# Patient Record
Sex: Male | Born: 1940 | Race: White | Hispanic: No | Marital: Single | Smoking: Never smoker
Health system: Southern US, Community
[De-identification: ages and names within clinical notes are randomized; demographics above are authoritative.]

## PROBLEM LIST (undated history)

## (undated) DIAGNOSIS — IMO0002 Reserved for concepts with insufficient information to code with codable children: Secondary | ICD-10-CM

## (undated) DIAGNOSIS — M1990 Unspecified osteoarthritis, unspecified site: Secondary | ICD-10-CM

## (undated) DIAGNOSIS — I10 Essential (primary) hypertension: Secondary | ICD-10-CM

## (undated) DIAGNOSIS — F419 Anxiety disorder, unspecified: Secondary | ICD-10-CM

## (undated) DIAGNOSIS — D649 Anemia, unspecified: Secondary | ICD-10-CM

## (undated) DIAGNOSIS — I4892 Unspecified atrial flutter: Secondary | ICD-10-CM

## (undated) DIAGNOSIS — R001 Bradycardia, unspecified: Secondary | ICD-10-CM

## (undated) DIAGNOSIS — I2510 Atherosclerotic heart disease of native coronary artery without angina pectoris: Secondary | ICD-10-CM

## (undated) DIAGNOSIS — M329 Systemic lupus erythematosus, unspecified: Secondary | ICD-10-CM

## (undated) DIAGNOSIS — IMO0001 Reserved for inherently not codable concepts without codable children: Secondary | ICD-10-CM

## (undated) DIAGNOSIS — N189 Chronic kidney disease, unspecified: Secondary | ICD-10-CM

## (undated) HISTORY — PX: TOTAL HIP ARTHROPLASTY: SHX124

## (undated) HISTORY — DX: Unspecified osteoarthritis, unspecified site: M19.90

## (undated) HISTORY — DX: Systemic lupus erythematosus, unspecified: M32.9

## (undated) HISTORY — DX: Unspecified atrial flutter: I48.92

## (undated) HISTORY — DX: Chronic kidney disease, unspecified: N18.9

## (undated) HISTORY — PX: LUMBAR DISC SURGERY: SHX700

## (undated) HISTORY — PX: OTHER SURGICAL HISTORY: SHX169

## (undated) HISTORY — DX: Bradycardia, unspecified: R00.1

## (undated) HISTORY — DX: Essential (primary) hypertension: I10

## (undated) HISTORY — DX: Anemia, unspecified: D64.9

## (undated) HISTORY — DX: Atherosclerotic heart disease of native coronary artery without angina pectoris: I25.10

## (undated) HISTORY — DX: Anxiety disorder, unspecified: F41.9

## (undated) HISTORY — PX: REPLACEMENT TOTAL KNEE BILATERAL: SUR1225

## (undated) HISTORY — PX: CORONARY ANGIOPLASTY: SHX604

## (undated) HISTORY — DX: Reserved for inherently not codable concepts without codable children: IMO0001

## (undated) HISTORY — DX: Reserved for concepts with insufficient information to code with codable children: IMO0002

---

## 2008-01-14 ENCOUNTER — Ambulatory Visit: Payer: Self-pay | Admitting: Cardiology

## 2008-01-30 ENCOUNTER — Ambulatory Visit: Payer: Self-pay | Admitting: Cardiology

## 2008-02-24 ENCOUNTER — Ambulatory Visit: Payer: Self-pay | Admitting: Cardiology

## 2008-02-24 ENCOUNTER — Encounter: Payer: Self-pay | Admitting: Physician Assistant

## 2008-03-03 ENCOUNTER — Encounter: Payer: Self-pay | Admitting: Physician Assistant

## 2008-03-13 ENCOUNTER — Ambulatory Visit: Payer: Self-pay | Admitting: Cardiology

## 2008-03-13 ENCOUNTER — Encounter: Payer: Self-pay | Admitting: Cardiology

## 2008-03-18 ENCOUNTER — Ambulatory Visit: Payer: Self-pay | Admitting: Cardiovascular Disease

## 2008-03-18 ENCOUNTER — Ambulatory Visit: Admission: RE | Admit: 2008-03-18 | Discharge: 2008-03-18 | Payer: Self-pay | Admitting: Cardiovascular Disease

## 2008-03-18 ENCOUNTER — Inpatient Hospital Stay (HOSPITAL_COMMUNITY): Admission: AD | Admit: 2008-03-18 | Discharge: 2008-03-19 | Payer: Self-pay | Admitting: Cardiovascular Disease

## 2008-03-18 ENCOUNTER — Ambulatory Visit: Payer: Self-pay | Admitting: Internal Medicine

## 2008-03-18 ENCOUNTER — Inpatient Hospital Stay (HOSPITAL_BASED_OUTPATIENT_CLINIC_OR_DEPARTMENT_OTHER): Admission: RE | Admit: 2008-03-18 | Discharge: 2008-03-18 | Payer: Self-pay | Admitting: Internal Medicine

## 2008-04-07 ENCOUNTER — Ambulatory Visit: Payer: Self-pay | Admitting: Cardiology

## 2008-04-09 ENCOUNTER — Ambulatory Visit: Payer: Self-pay | Admitting: Cardiovascular Disease

## 2008-04-09 ENCOUNTER — Ambulatory Visit (HOSPITAL_COMMUNITY): Admission: RE | Admit: 2008-04-09 | Discharge: 2008-04-09 | Payer: Self-pay | Admitting: Cardiology

## 2008-04-09 ENCOUNTER — Encounter: Payer: Self-pay | Admitting: Cardiology

## 2008-04-09 ENCOUNTER — Ambulatory Visit: Payer: Self-pay | Admitting: Vascular Surgery

## 2008-04-23 ENCOUNTER — Encounter: Payer: Self-pay | Admitting: Physician Assistant

## 2008-05-01 ENCOUNTER — Encounter: Payer: Self-pay | Admitting: Physician Assistant

## 2008-05-05 ENCOUNTER — Ambulatory Visit: Payer: Self-pay | Admitting: Cardiology

## 2008-05-08 ENCOUNTER — Ambulatory Visit: Payer: Self-pay | Admitting: Cardiology

## 2008-05-11 ENCOUNTER — Encounter: Payer: Self-pay | Admitting: Cardiology

## 2008-05-26 ENCOUNTER — Encounter: Payer: Self-pay | Admitting: Cardiology

## 2008-05-27 ENCOUNTER — Ambulatory Visit: Payer: Self-pay | Admitting: Cardiology

## 2008-06-24 ENCOUNTER — Ambulatory Visit: Payer: Self-pay | Admitting: Cardiology

## 2008-07-06 ENCOUNTER — Ambulatory Visit: Payer: Self-pay | Admitting: Cardiology

## 2008-07-22 ENCOUNTER — Encounter: Payer: Self-pay | Admitting: Physician Assistant

## 2008-07-22 ENCOUNTER — Ambulatory Visit: Payer: Self-pay | Admitting: Cardiology

## 2008-09-25 ENCOUNTER — Ambulatory Visit: Payer: Self-pay | Admitting: Cardiology

## 2008-11-19 ENCOUNTER — Encounter: Payer: Self-pay | Admitting: Cardiology

## 2008-12-29 ENCOUNTER — Encounter: Payer: Self-pay | Admitting: Cardiology

## 2009-01-18 ENCOUNTER — Ambulatory Visit: Payer: Self-pay | Admitting: Cardiology

## 2009-01-20 ENCOUNTER — Ambulatory Visit: Payer: Self-pay | Admitting: Cardiology

## 2009-01-20 ENCOUNTER — Encounter: Payer: Self-pay | Admitting: Cardiology

## 2009-02-24 ENCOUNTER — Ambulatory Visit: Payer: Self-pay | Admitting: Cardiovascular Disease

## 2009-02-24 ENCOUNTER — Inpatient Hospital Stay (HOSPITAL_COMMUNITY): Admission: RE | Admit: 2009-02-24 | Discharge: 2009-03-01 | Payer: Self-pay | Admitting: Orthopedic Surgery

## 2009-02-25 ENCOUNTER — Encounter: Payer: Self-pay | Admitting: Cardiology

## 2009-03-09 ENCOUNTER — Ambulatory Visit: Payer: Self-pay | Admitting: Cardiology

## 2009-03-09 ENCOUNTER — Encounter: Admission: RE | Admit: 2009-03-09 | Discharge: 2009-03-09 | Payer: Self-pay | Admitting: Orthopedic Surgery

## 2009-03-16 ENCOUNTER — Encounter: Payer: Self-pay | Admitting: Cardiology

## 2009-03-19 ENCOUNTER — Ambulatory Visit: Payer: Self-pay | Admitting: Cardiology

## 2009-03-23 ENCOUNTER — Telehealth: Payer: Self-pay | Admitting: Cardiology

## 2009-03-30 ENCOUNTER — Ambulatory Visit: Payer: Self-pay | Admitting: Cardiology

## 2009-04-21 ENCOUNTER — Telehealth (INDEPENDENT_AMBULATORY_CARE_PROVIDER_SITE_OTHER): Payer: Self-pay | Admitting: *Deleted

## 2009-05-27 ENCOUNTER — Encounter: Payer: Self-pay | Admitting: Cardiology

## 2009-05-27 ENCOUNTER — Inpatient Hospital Stay (HOSPITAL_COMMUNITY): Admission: EM | Admit: 2009-05-27 | Discharge: 2009-05-28 | Payer: Self-pay | Admitting: Emergency Medicine

## 2009-05-28 ENCOUNTER — Encounter: Payer: Self-pay | Admitting: Infectious Diseases

## 2009-06-06 ENCOUNTER — Ambulatory Visit: Payer: Self-pay | Admitting: Infectious Diseases

## 2009-06-07 ENCOUNTER — Telehealth (INDEPENDENT_AMBULATORY_CARE_PROVIDER_SITE_OTHER): Payer: Self-pay | Admitting: *Deleted

## 2009-06-17 ENCOUNTER — Encounter: Payer: Self-pay | Admitting: Cardiology

## 2009-06-17 DIAGNOSIS — M4800 Spinal stenosis, site unspecified: Secondary | ICD-10-CM

## 2009-06-17 DIAGNOSIS — Z8679 Personal history of other diseases of the circulatory system: Secondary | ICD-10-CM | POA: Insufficient documentation

## 2009-06-17 DIAGNOSIS — F341 Dysthymic disorder: Secondary | ICD-10-CM

## 2009-06-17 DIAGNOSIS — Z96649 Presence of unspecified artificial hip joint: Secondary | ICD-10-CM

## 2009-06-17 DIAGNOSIS — R413 Other amnesia: Secondary | ICD-10-CM | POA: Insufficient documentation

## 2009-06-17 DIAGNOSIS — I10 Essential (primary) hypertension: Secondary | ICD-10-CM | POA: Insufficient documentation

## 2009-06-17 DIAGNOSIS — I259 Chronic ischemic heart disease, unspecified: Secondary | ICD-10-CM

## 2009-06-17 DIAGNOSIS — I2510 Atherosclerotic heart disease of native coronary artery without angina pectoris: Secondary | ICD-10-CM | POA: Insufficient documentation

## 2009-06-29 ENCOUNTER — Telehealth (INDEPENDENT_AMBULATORY_CARE_PROVIDER_SITE_OTHER): Payer: Self-pay | Admitting: *Deleted

## 2009-09-07 ENCOUNTER — Encounter: Payer: Self-pay | Admitting: Cardiology

## 2009-09-28 ENCOUNTER — Ambulatory Visit: Payer: Self-pay | Admitting: Cardiology

## 2009-10-05 ENCOUNTER — Ambulatory Visit (HOSPITAL_COMMUNITY): Admission: RE | Admit: 2009-10-05 | Discharge: 2009-10-05 | Payer: Self-pay | Admitting: Orthopedic Surgery

## 2009-10-05 ENCOUNTER — Encounter: Payer: Self-pay | Admitting: Cardiology

## 2010-01-24 ENCOUNTER — Encounter: Payer: Self-pay | Admitting: Cardiology

## 2010-03-29 ENCOUNTER — Encounter: Payer: Self-pay | Admitting: Cardiology

## 2010-04-29 ENCOUNTER — Encounter: Payer: Self-pay | Admitting: Cardiology

## 2010-06-09 ENCOUNTER — Encounter: Payer: Self-pay | Admitting: Cardiology

## 2010-06-14 ENCOUNTER — Encounter: Payer: Self-pay | Admitting: Cardiology

## 2010-06-17 ENCOUNTER — Encounter: Payer: Self-pay | Admitting: Cardiology

## 2010-06-24 ENCOUNTER — Ambulatory Visit: Payer: Self-pay | Admitting: Cardiology

## 2010-06-24 DIAGNOSIS — R609 Edema, unspecified: Secondary | ICD-10-CM

## 2010-06-24 DIAGNOSIS — D649 Anemia, unspecified: Secondary | ICD-10-CM

## 2010-07-13 ENCOUNTER — Encounter: Payer: Self-pay | Admitting: Cardiology

## 2010-09-06 NOTE — Assessment & Plan Note (Signed)
Summary: rov-r/s from no show on 11/11   Visit Type:  Follow-up Primary Provider:  Samuel Jester  CC:  follow-up visit.  History of Present Illness: the patient is a 70 year old male with recent coronary arteries these, hypertension, bilateral knee and hip replacements.  The patient now reports that he is left hip post surgery pass degenerated and will require a redo operation.  The patient denies any chest pain.  He denies any palpitation.  From a cardiac standpoint he is stable.  He has no orthopnea PND, presyncope or syncope.  Clinical Review Panels:  Cardiac Imaging Cardiac Cath Findings 3-vessel coronary artery disease with high-grade proximal right coronary artery lesion otherwise nonobstructive disease normal LV function patent renal arteries. (03/18/2008)    Preventive Screening-Counseling & Management  Alcohol-Tobacco     Smoking Status: never  Current Medications (verified): 1)  Benazepril Hcl 40 Mg Tabs (Benazepril Hcl) .Marland Kitchen.. 1 Tab By Mouth Once Daily 2)  Hydralazine Hcl 50 Mg Tabs (Hydralazine Hcl) .... Take 1 Tablet By Mouth Four Times A Day 3)  Labetalol Hcl 300 Mg Tabs (Labetalol Hcl) .... Take 1 Tablet By Mouth Three Times A Day 4)  Maxzide-25 37.5-25 Mg Tabs (Triamterene-Hctz) .... Take 1 Tablet By Mouth Once A Day 5)  Metoprolol Tartrate 50 Mg Tabs (Metoprolol Tartrate) .... Take 1/2 Tablet By Mouth Single Dose 6)  Nitroglycerin 0.4 Mg Subl (Nitroglycerin) .... Place 1 Tablet Under Tongue As Directed 7)  Terazosin Hcl 5 Mg Caps (Terazosin Hcl) .... Take 1 Tab By Mouth At Bedtime 8)  Isosorbide Mononitrate Cr 30 Mg Xr24h-Tab (Isosorbide Mononitrate) .... Take 1 Tablet By Mouth Once A Day 9)  Desipramine Hcl 50 Mg Tabs (Desipramine Hcl) .... Take 1 Tab By Mouth At Bedtime 10)  Ecotrin 325 Mg Tbec (Aspirin) .... Take 1 Tablet By Mouth Once A Day 11)  Alprazolam 1 Mg Tabs (Alprazolam) .... Take 1 Tab By Mouth At Bedtime 12)  Vitamin D 400 Unit Caps  (Cholecalciferol) .... Take 1 Tablet By Mouth Once A Day 13)  Omeprazole 20 Mg Cpdr (Omeprazole) .... 1/2 - 1 Tab Daily As Needed 14)  Diclofenac Sodium 50 Mg Tbec (Diclofenac Sodium) .... Take 2 Tabs Two Times A Day 15)  Niacin 500 Mg Tabs (Niacin) .... 2 Tabs Two Times A Day 16)  Red Yeast Rice Extract 600 Mg Caps (Red Yeast Rice Extract) .... Take 1 Tablet By Mouth Two Times A Day 17)  Folic Acid 800 Mcg Tabs (Folic Acid) .... Take 1 Tablet By Mouth Once A Day 18)  Zinc Magnesium Aspartate 150-3.83-10 Mg Caps (Zinc-Magnesium Aspart-Vit B6) .... Take 1 Tablet By Mouth Once A Day 19)  Oxycodone-Acetaminophen 10-650 Mg Tabs (Oxycodone-Acetaminophen) .... Take 1 Tablet By Mouth Two Times A Day As Needed 20)  Centrum Silver  Tabs (Multiple Vitamins-Minerals) .... Take 1 Tablet By Mouth Once A Day 21)  Seroquel 50 Mg Tabs (Quetiapine Fumarate) .... Take 1 Tablet By Mouth Once A Day  Allergies (verified): 1)  * Citalopram 2)  Codeine  Comments:  Nurse/Medical Assistant: The patient's medications and allergies were reviewed with the patient and were updated in the Medication and Allergy Lists. Bottles reviewed.  Past History:  Past Medical History: Last updated: 06/17/2009 severe single-vessel coronary artery disease status-post platinum drug eluding stenting of total proximal right coronary artery August 2009 Residual nonobstructed LAD and circumflex disease Normal left ventricular function Status post right femoral pseudoaneurysm successfully treated wiith thrombin injection History of transient atrial flutter normal sinus  rhythm with PVCs and PACs on subsequent cardiac monitoring hypertension dyslipidemia depression mild renal insufficiency GFR 50 and mils/min  1. Lower extremity edema.  Questionable venous stasis status post     recent hip surgery (unlike to significant volume overload). 2. Palpitations, resolved. 3. Normal LV function. 4. Single-vessel coronary artery status  post drug-eluting stenting to     the right coronary artery 10 months ago. 5. Systolic hypertension, controlled. 6. Dyslipidemia. 7. Anxiety and depression, improved. 8. Hyperkalemia, resolved. 9. 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  Past Surgical History: Last updated: 05/31/2008 bilateral total knee replacement history of lumbar surgery cataract surgery BPH  Family History: Last updated: 05/31/2008 father had premature coronary artery disease  Social History: Last updated: 05/31/2008 the patient is divorced several grown children he denies tobacco smoking he drinks occasional glass of alcohol  Risk Factors: Smoking Status: never (09/28/2009)  Past Cardiac History:  [facility] Hospitalizations:   DISCHARGE DIAGNOSES: 1. Acute confusion or short-term memory loss. 2. Left hip pain or back pain. 3. Anxiety. 4. History of coronary artery disease. 5. Hypertension. 6. Chronic arthritis. 7. Hypertension. 8. Bilateral knee replacements and bilateral hip replacements.  PROCEDURES PERFORMED ON HIM: 1. On June 06, 2009, a chest x-ray was done which was suggestive of     cardiomegaly with low lung volumes. 2. A Pelvis x-ray done on June 06, 2009, was suggestive of no acute     bony abnormality. 3. X-ray of the sacrum or coccyx done on May 27, 2009, was     suggestive of no acute bony abnormality. 4. A CT scan of the head done without contrast on May 27, 2009,     was suggestive of negative for bleed or other acute intracranial     process.  Atrophy and nonspecific white matter changes were     present.  HOSPITAL COURSE BY PROBLEM: 1. Confusion/memory loss: The patient was admitted in order to clarify     the etiology for his newly developed acute confusion/short-term     memory loss and change in his memory pattern.  Among the     differentials, we considered senile dementia, polypharmacy, side     effect of the drugs that he is taking,  encephalitis, or possibly     seizures or drug abuse are some of the causes.  We did the workup     with CT scan of the head which was not suggestive of anything.  We     did repeat the TSH, HbA1c, urine drug screen, ESR, uric acid,     everything was negative for any particular etiology suggesting the     cause for the patient's is recently developed acute short-term     memory loss.  We concluded that the patient might be suffering from     some depression as everything started when the home health     physiotherapy stopped coming to the home though the patient     suggests that he has a daughter who takes care of him everyday but     acute manic depression cannot be ruled out completely.  We     increased the doses of the patient's desipramine from 100 to 150 mg     daily, and   Social History: Smoking Status:  never  Review of Systems       The patient complains of fatigue, muscle weakness, joint pain, and depression.    Vital Signs:  Patient profile:   69  year old male Height:      64 inches Weight:      203 pounds BMI:     34.97 Pulse rate:   56 / minute BP sitting:   149 / 72  (left arm) Cuff size:   regular  Vitals Entered By: Carlye Grippe (September 28, 2009 2:34 PM)  Nutrition Counseling: Patient's BMI is greater than 25 and therefore counseled on weight management options. CC: follow-up visit   Physical Exam  Additional Exam:  General: Well-developed, well-nourished in no distress head: Normocephalic and atraumatic eyes PERRLA/EOMI intact, conjunctiva and lids normal nose: No deformity or lesions mouth normal dentition, normal posterior pharynx neck: Supple, no JVD.  No masses, thyromegaly or abnormal cervical nodes lungs: Normal breath sounds bilaterally without wheezing.  Normal percussion heart: regular rate and rhythm with normal S1 and S2, no S3 or S4.  PMI is normal.  No pathological murmurs abdomen: Normal bowel sounds, abdomen is soft and nontender  without masses, organomegaly or hernias noted.  No hepatosplenomegaly musculoskeletal: patient reports back pain.  He has decreased strength in both lower extremities.  He has difficulty flexing both left and right hip.  He is walking with a walker. pulsus: Pulse is normal in all 4 extremities Extremities: No peripheral pitting edema neurologic: Alert and oriented x 3 skin: Intact without lesions or rashes cervical nodes: No significant adenopathy psychologic: Normal affect    Impression & Recommendations:  Problem # 1:  ESSENTIAL HYPERTENSION, BENIGN (ICD-401.1) blood pressure is well-controlled.  I made no changes in his medical regimen. His updated medication list for this problem includes:    Benazepril Hcl 40 Mg Tabs (Benazepril hcl) .Marland Kitchen... 1 tab by mouth once daily    Hydralazine Hcl 50 Mg Tabs (Hydralazine hcl) .Marland Kitchen... Take 1 tablet by mouth four times a day    Labetalol Hcl 300 Mg Tabs (Labetalol hcl) .Marland Kitchen... Take 1 tablet by mouth three times a day    Maxzide-25 37.5-25 Mg Tabs (Triamterene-hctz) .Marland Kitchen... Take 1 tablet by mouth once a day    Metoprolol Tartrate 50 Mg Tabs (Metoprolol tartrate) .Marland Kitchen... Take 1/2 tablet by mouth single dose    Terazosin Hcl 5 Mg Caps (Terazosin hcl) .Marland Kitchen... Take 1 tab by mouth at bedtime    Ecotrin 325 Mg Tbec (Aspirin) .Marland Kitchen... Take 1 tablet by mouth once a day  Problem # 2:  MEMORY LOSS (ICD-780.93) the patient reported other recent episodes of memory loss.  Differential diagnoses I see no dementia, polypharmacy or side effects of drugs.  A neurological work up was essentially within normal limits.  There was also question of acute manic depression and his hip remained was increased from 100 to 150 mg p.o. daily  Problem # 3:  SPINAL STENOSIS (ICD-724.00) Assessment: Comment Only  Problem # 4:  HIP REPLACEMENT, HX OF (ICD-V43.64) the patient will need a redo operation.  Should be stable from a cardiovascular standpoint.  Problem # 5:  CORONARY ARTERY  DISEASE, S/P PTCA (ICD-414.9) the patient has no recurrent chest pain.  No indication for ischemia workup presently. His updated medication list for this problem includes:    Benazepril Hcl 40 Mg Tabs (Benazepril hcl) .Marland Kitchen... 1 tab by mouth once daily    Labetalol Hcl 300 Mg Tabs (Labetalol hcl) .Marland Kitchen... Take 1 tablet by mouth three times a day    Metoprolol Tartrate 50 Mg Tabs (Metoprolol tartrate) .Marland Kitchen... Take 1/2 tablet by mouth single dose    Nitroglycerin 0.4 Mg Subl (Nitroglycerin) .Marland Kitchen... Place  1 tablet under tongue as directed    Isosorbide Mononitrate Cr 30 Mg Xr24h-tab (Isosorbide mononitrate) .Marland Kitchen... Take 1 tablet by mouth once a day    Ecotrin 325 Mg Tbec (Aspirin) .Marland Kitchen... Take 1 tablet by mouth once a day  Patient Instructions: 1)  Your physician recommends that you continue on your current medications as directed. Please refer to the Current Medication list given to you today. 2)  Follow up in  6 months

## 2010-09-06 NOTE — Progress Notes (Signed)
Summary: Office Visit/ BLOOD PRESSURE READINGS  Office Visit/ BLOOD PRESSURE READINGS   Imported By: Dorise Hiss 09/07/2009 12:24:20  _____________________________________________________________________  External Attachment:    Type:   Image     Comment:   External Document  Appended Document: Office Visit/ BLOOD PRESSURE READINGS BP very high. Please confirm medication list- patient should not be on metoprolol and labeatalol. D/C metoprolol if so and add clonidine 0.1mg  by mouth two times a day. He needs to be seen by his LMD to have this addressed also. Further uptitration will need to happen with OV- issues to discuss. Make sure patient is compliant with medications.   Appended Document: Office Visit/ BLOOD PRESSURE READINGS Left message to return call.  Appended Document: Office Visit/ BLOOD PRESSURE READINGS Call to pt.  Updated med. list.  Please review.  States he is only taking Metoprolol as needed and uses this rarely.  He does state that he has had hip replacement recently and is having some pain lately which could be affecting his blood pressure.  Is going to orhto md today, not sure if pain in back or coming from hip.  Does has OV here on 2/22.  Do you still recommend Clonidine?    Clinical Lists Changes  Medications: Removed medication of COREG 6.25 MG TABS (CARVEDILOL) Take 1 tablet by mouth twice a day - Signed Removed medication of PLAVIX 75 MG TABS (CLOPIDOGREL BISULFATE) Take 1 tablet by mouth once a day - Signed Removed medication of FUROSEMIDE 20 MG TABS (FUROSEMIDE) Take one tablet by mouth daily. - Signed Added new medication of TERAZOSIN HCL 5 MG CAPS (TERAZOSIN HCL) Take 1 tab by mouth at bedtime - Signed Added new medication of ISOSORBIDE MONONITRATE CR 30 MG XR24H-TAB (ISOSORBIDE MONONITRATE) Take 1 tablet by mouth once a day - Signed Added new medication of DESIPRAMINE HCL 50 MG TABS (DESIPRAMINE HCL) Take 1 tab by mouth at bedtime - Signed Added new  medication of ASPIRIN 81 MG TBEC (ASPIRIN) Take 1 tablet by mouth once a day - Signed Added new medication of ALPRAZOLAM 1 MG TABS (ALPRAZOLAM) Take 1 tab by mouth at bedtime - Signed Added new medication of VITAMIN D 400 UNIT CAPS (CHOLECALCIFEROL) Take 1 tablet by mouth once a day - Signed Added new medication of OMEPRAZOLE 20 MG CPDR (OMEPRAZOLE) 1/2 - 1 tab daily as needed - Signed Added new medication of DICLOFENAC SODIUM 50 MG TBEC (DICLOFENAC SODIUM) take 2 tabs two times a day - Signed Added new medication of NIACIN 500 MG TABS (NIACIN) 2 tabs two times a day - Signed Added new medication of RED YEAST RICE EXTRACT 600 MG CAPS (RED YEAST RICE EXTRACT) Take 1 tablet by mouth two times a day - Signed Added new medication of FOLIC ACID 800 MCG TABS (FOLIC ACID) Take 1 tablet by mouth once a day - Signed Added new medication of ZINC MAGNESIUM ASPARTATE 150-3.83-10 MG CAPS (ZINC-MAGNESIUM ASPART-VIT B6) Take 1 tablet by mouth once a day - Signed Added new medication of OXYCODONE-ACETAMINOPHEN 5-325 MG TABS (OXYCODONE-ACETAMINOPHEN) 2 tabs two times a day as needed - Signed     Yes I would add clonidine 0.1 mg p.o. b.i.d. Ask patient to continue to monitor his blood pressure Lewayne Bunting, MD, [facility]  September 20, 2009 5:27 PM  Appended Document: Office Visit/ BLOOD PRESSURE READINGS Pt. was seen in office on 2/22.  Did you still want to make this change?  Appended Document: Office Visit/ BLOOD PRESSURE READINGS No BP was  controlled during last office visit.

## 2010-09-06 NOTE — Miscellaneous (Signed)
Summary: rx - Ntg.  Clinical Lists Changes  Medications: Changed medication from NITROGLYCERIN 0.4 MG SUBL (NITROGLYCERIN) Place 1 tablet under tongue as directed to NITROSTAT 0.4 MG SUBL (NITROGLYCERIN) dissolve one tablet under tongue for severe chest pain as needed every 5 minutes, not to exceed 3 in 15 min time frame - Signed Rx of NITROSTAT 0.4 MG SUBL (NITROGLYCERIN) dissolve one tablet under tongue for severe chest pain as needed every 5 minutes, not to exceed 3 in 15 min time frame;  #25 x 3;  Signed;  Entered by: Hoover Brunette, LPN;  Authorized by: Lewayne Bunting, MD, [facility];  Method used: Electronically to [facility]*, [address] [place], [place]  [address], Ph: [phone], Fax: [phone]    Prescriptions: NITROSTAT 0.4 MG SUBL (NITROGLYCERIN) dissolve one tablet under tongue for severe chest pain as needed every 5 minutes, not to exceed 3 in 15 min time frame  #25 x 3   Entered by:   Hoover Brunette, LPN   Authorized by:   Lewayne Bunting, MD, [facility]   Signed by:   Hoover Brunette, LPN on 25/95/6387   Method used:   Electronically to        [facility]* (retail)       [address]       [place], [place]  [address]       Ph: [phone]       Fax: [phone]   RxID:   [id number]

## 2010-09-06 NOTE — Letter (Signed)
Summary: External Correspondence/ FAXED [facility]  External Correspondence/ FAXED [facility]   Imported By: Dorise Hiss 05/24/2010 15:21:09  _____________________________________________________________________  External Attachment:    Type:   Image     Comment:   External Document

## 2010-09-06 NOTE — Letter (Signed)
Summary: External Correspondence/ [facility]  External Correspondence/ [facility]   Imported By: Dorise Hiss 06/23/2010 10:21:05  _____________________________________________________________________  External Attachment:    Type:   Image     Comment:   External Document

## 2010-09-06 NOTE — Assessment & Plan Note (Signed)
Summary: rov  --recently in [place] with MI  --agh   Visit Type:  Follow-up Primary Provider:  Samuel Jester   History of Present Illness: the patientthis 70 year old male with a history of single-vessel coronary disease status post drug-eluting stent placement to the proximal right coronary artery in 2009. He has residual nonobstructive disease of the LAD and circumflex coronary area he has normal LV function. He has a history of transient atrial flutter. He has mild renal insufficiency.the patient also has a history of anxiety and chronic arthritis and is status post bilateral knee and hip replacements. the patient required a redo operation because of left hip prosthesis has fractures through the acetabulum and migrated superior and medially. laboratory work from June 2011 shows total cholesterol 128 LDL cholesterol 71 HDL cholesterol 44.creatinine 1.14. Potassium 5.3 sodium 138. Alkaline phosphatase elevated at 130. Had to undergo surgery x2. Able to walk with walker.feels very weak. Developed. still on Vancomycin through PICC line.  No paplps. No scp. But possible small NSTEMI. BP difficult to control. Did echo at [facility]. 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 Stop Triamterene/HCTZ. START la six 40mg  day with spironlactone.  Dizzy when standing up. Anemia. Needed 9 units of blood. Hb =8. Needs anemia Dorna Bloom.  Preventive Screening-Counseling & Management  Alcohol-Tobacco     Smoking Status: never  Current Medications (verified): 1)  Benazepril Hcl 40 Mg Tabs (Benazepril Hcl) .Marland Kitchen.. 1 Tab By Mouth Once Daily 2)  Hydralazine Hcl 25 Mg Tabs (Hydralazine Hcl) .... Take 3 Tablet By Mouth Four Times A Day 3)  Labetalol Hcl 300 Mg Tabs (Labetalol Hcl) .... Take 1 Tablet By Mouth Three Times A Day 4)  Nitrostat 0.4 Mg Subl (Nitroglycerin) .... Dissolve One Tablet Under Tongue For Severe Chest Pain As Needed Every 5 Minutes, Not To Exceed 3 in 15 Min Time Frame 5)  Terazosin Hcl 5 Mg  Caps (Terazosin Hcl) .... Take 1 Tab By Mouth At Bedtime 6)  Isosorbide Mononitrate Cr 30 Mg Xr24h-Tab (Isosorbide Mononitrate) .... Take 1 Tablet By Mouth Once A Day 7)  Desipramine Hcl 100 Mg Tabs (Desipramine Hcl) .... Take 1 Tablet By Mouth Once A Day At Bedtime 8)  Ecotrin 325 Mg Tbec (Aspirin) .... Take 1 Tablet By Mouth Once A Day 9)  Alprazolam 1 Mg Tabs (Alprazolam) .... Take 1-2 Tab By Mouth At Bedtime As Needed 10)  Omeprazole 20 Mg Cpdr (Omeprazole) .... 1/2  Tab Daily As Needed 11)  Niacin 500 Mg Tabs (Niacin) .... 2 Tabs Two Times A Day 12)  Red Yeast Rice Extract 600 Mg Caps (Red Yeast Rice Extract) .... Take 1 Tablet By Mouth Two Times A Day 13)  Folic Acid 800 Mcg Tabs (Folic Acid) .... Take 1 Tablet By Mouth Once A Day 14)  Zinc Magnesium Aspartate 150-3.83-10 Mg Caps (Zinc-Magnesium Aspart-Vit B6) .... Take 1 Tablet By Mouth Once A Day 15)  Centrum Silver  Tabs (Multiple Vitamins-Minerals) .... Take 1 Tablet By Mouth Once A Day 16)  Seroquel 50 Mg Tabs (Quetiapine Fumarate) .... Take 1 Tablet By Mouth Once A Day 17)  Vancomycin Hcl 1000 Mg Solr (Vancomycin Hcl) .... Infuse 1.5gm/248ml Daily 18)  Minoxidil 2.5 Mg Tabs (Minoxidil) .... Take 2 Tablet By Mouth Once A Day 19)  Ferrex 150 150 Mg Caps (Polysaccharide Iron Complex) .... Take 1 Tablet By Mouth Two Times A Day 20)  Clonidine Hcl 0.1 Mg Tabs (Clonidine Hcl) .... Take 1 Tablet By Mouth Two Times A Day  21)  Risaquad  Caps (Probiotic Product) .... Take 1 Tablet By Mouth Once A Day 22)  First-Mouthwash Blm  Susp (Dph-Lido-Alhydr-Mghydr-Simeth) .Marland Kitchen.. 10ml By Mouth Four Times A Day 23)  Vitamin D3 400 Unit Tabs (Cholecalciferol) .... Take 2 Tabs (800 Units) Daily 24)  Calcium 400mg  .... Take 1 Tablet By Mouth Once A Day 25)  Lasix 40 Mg Tabs (Furosemide) .... Take 1 Tablet By Mouth Once A Day 26)  Spironolactone 25 Mg Tabs (Spironolactone) .... Take 1 Tablet By Mouth Once A Day  Allergies (verified): 1)  * Citalopram 2)   Codeine  Comments:  Nurse/Medical Assistant: The patient's medication bottles and allergies were reviewed with the patient and were updated in the Medication and Allergy Lists.  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 Lower extremity edema.  Questionable venous stasis status post     recent hip surgery (unlike to significant volume overload). 2. Palpitations, resolved. 3. Normal LV function. 4. Single-vessel coronary artery status post drug-eluting stenting to     the right coronary artery 10 months ago. 5. Systolic hypertension, controlled. 6. Dyslipidemia. 7. Anxiety and depression, improved. 8. Hyperkalemia, resolved. 9. 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  Past Surgical History: Last updated: 05/31/2008 bilateral total knee replacement history of lumbar surgery cataract surgery BPH  Family History: Last updated: 05/31/2008 father had premature coronary artery disease  Social History: Last updated: 05/31/2008 the patient is divorced several grown children he denies tobacco smoking he drinks occasional glass of alcohol  Risk Factors: Smoking Status: never (06/24/2010)  Past Cardiac History:  [facility] Hospitalizations:   DISCHARGE DIAGNOSES: 1. Acute confusion or short-term memory loss. 2. Left hip pain or back pain. 3. Anxiety. 4. History of coronary artery disease. 5. Hypertension. 6. Chronic arthritis. 7.  Hypertension. 8. Bilateral knee replacements and bilateral hip replacements.  PROCEDURES PERFORMED ON HIM: 1. On June 06, 2009, a chest x-ray was done which was suggestive of     cardiomegaly with low lung volumes. 2. A Pelvis x-ray done on June 06, 2009, was suggestive of no acute     bony abnormality. 3. X-ray of the sacrum or coccyx done on May 27, 2009, was     suggestive of no acute bony abnormality. 4. A CT scan of the head done without contrast on May 27, 2009,     was suggestive of negative for bleed or other acute intracranial     process.  Atrophy and nonspecific white matter changes were     present.  HOSPITAL COURSE BY PROBLEM: 1. Confusion/memory loss: The patient was admitted in order to clarify     the etiology for his newly developed acute confusion/short-term     memory loss and change in his memory pattern.  Among the     differentials, we considered senile dementia, polypharmacy, side     effect of the drugs that he is taking, encephalitis, or possibly     seizures or drug abuse are some of the causes.  We did the workup     with CT scan of the head which was not  suggestive of anything.  We     did repeat the TSH, HbA1c, urine drug screen, ESR, uric acid,     everything was negative for any particular etiology suggesting the     cause for the patient's is recently developed acute short-term     memory loss.  We concluded that the patient might be suffering from     some depression as everything started when the home health     physiotherapy stopped coming to the home though the patient     suggests that he has a daughter who takes care of him everyday but     acute manic depression cannot be ruled out completely.  We     increased the doses of the patient's desipramine from 100 to 150 mg     daily, and   Vital Signs:  Patient profile:   70 year old male Height:      64 inches Weight:      192 pounds Pulse rate:   60 / minute BP sitting:   147 /  66  (left arm) Cuff size:   large  Vitals Entered By: Carlye Grippe (June 24, 2010 1:24 PM)  Physical Exam  Additional Exam:  General: Well-developed, well-nourished in no distress head: Normocephalic and atraumatic eyes PERRLA/EOMI intact, conjunctiva and lids normal nose: No deformity or lesions mouth normal dentition, normal posterior pharynx neck: Supple, no JVD.  No masses, thyromegaly or abnormal cervical nodes lungs: Normal breath sounds bilaterally without wheezing.  Normal percussion heart: regular rate and rhythm with normal S1 and S2, no S3 or S4.  PMI is normal.  No pathological murmurs abdomen: Normal bowel sounds, abdomen is soft and nontender without masses, organomegaly or hernias noted.  No hepatosplenomegaly musculoskeletal: patient reports back pain.  He has decreased strength in both lower extremities.  He has difficulty flexing both left and right hip.  He is walking with a walker. pulsus: Pulse is normal in all 4 extremities Extremities: No peripheral pitting edema neurologic: Alert and oriented x 3 skin: Intact without lesions or rashes cervical nodes: No significant adenopathy psychologic: Normal affect    Impression & Recommendations:  Problem # 1:  ANEMIA (ICD-285.9) the patient is followed for his anemia.  Apparently after his last surgery he required 9 units of blood.  We will check his CBC and iron studies particularly in light of his coronary artery disease. Orders: T-CBC w/Diff [id number]) T-Basic Metabolic Panel [id number]) T-Reticulocyte Count, Manual (29562) T-Iron [id number]) T-Iron Binding Capacity (TIBC) ([id number]) T-Ferritin [id number]) T-Vitamin B12 ([id number]) T-Folic Acid; RBC ([id number]) T- * Misc. Laboratory test [id number])  Problem # 2:  SPINAL STENOSIS (ICD-724.00) difficulty walking  Problem # 3:  ESSENTIAL HYPERTENSION, BENIGN (ICD-401.1) the patient's blood pressure difficult to control and has been placed on  minoxidil.  The likely has edema secondary to minoxidil.  Will stop triamterene chlorothiazide and start Lasix 40 mg a day and spironolactone. The following medications were removed from the medication list:    Maxzide-25 37.5-25 Mg Tabs (Triamterene-hctz) .Marland Kitchen... Take 1 tablet by mouth once a day    Metoprolol Tartrate 50 Mg Tabs (Metoprolol tartrate) .Marland Kitchen... Take 1/2 tablet by mouth single dose His updated medication list for this problem includes:    Benazepril Hcl 40 Mg Tabs (Benazepril hcl) .Marland Kitchen... 1 tab by mouth once daily    Hydralazine Hcl 25 Mg Tabs (Hydralazine hcl) .Marland Kitchen... Take 3 tablet by mouth four times a day    Labetalol Hcl 300 Mg Tabs (  Labetalol hcl) .Marland Kitchen... Take 1 tablet by mouth three times a day    Terazosin Hcl 5 Mg Caps (Terazosin hcl) .Marland Kitchen... Take 1 tab by mouth at bedtime    Ecotrin 325 Mg Tbec (Aspirin) .Marland Kitchen... Take 1 tablet by mouth once a day    Minoxidil 2.5 Mg Tabs (Minoxidil) .Marland Kitchen... Take 2 tablet by mouth once a day    Clonidine Hcl 0.1 Mg Tabs (Clonidine hcl) .Marland Kitchen... Take 1 tablet by mouth two times a day    Lasix 40 Mg Tabs (Furosemide) .Marland Kitchen... Take 1 tablet by mouth once a day    Spironolactone 25 Mg Tabs (Spironolactone) .Marland Kitchen... Take 1 tablet by mouth once a day  Problem # 4:  CORONARY ARTERY DISEASE, S/P PTCA (ICD-414.9) patient history coronary artery disease with drug alluding stent to the proximal right coronary artery in 2009.  Currently not on Plavix anymore he has no chest pain it's unclear if during his recent surgery he had a possible small non-ST elevation microinfarction in any event this will be treated medically The following medications were removed from the medication list:    Metoprolol Tartrate 50 Mg Tabs (Metoprolol tartrate) .Marland Kitchen... Take 1/2 tablet by mouth single dose His updated medication list for this problem includes:    Benazepril Hcl 40 Mg Tabs (Benazepril hcl) .Marland Kitchen... 1 tab by mouth once daily    Labetalol Hcl 300 Mg Tabs (Labetalol hcl) .Marland Kitchen... Take 1 tablet by  mouth three times a day    Nitrostat 0.4 Mg Subl (Nitroglycerin) .Marland Kitchen... Dissolve one tablet under tongue for severe chest pain as needed every 5 minutes, not to exceed 3 in 15 min time frame    Isosorbide Mononitrate Cr 30 Mg Xr24h-tab (Isosorbide mononitrate) .Marland Kitchen... Take 1 tablet by mouth once a day    Ecotrin 325 Mg Tbec (Aspirin) .Marland Kitchen... Take 1 tablet by mouth once a day  Other Orders: EKG w/ Interpretation (93000)  Patient Instructions: 1)  Stop Triamterene 2)  Vitamin D3 800 units daily 3)  Calcium 400mg  daily 4)  Lasix 40mg  daily 5)  Spironolactone 25mg  daily 6)  Labs: to be done with home health on Monday, 11/28 7)  Follow up in  3 months Prescriptions: SPIRONOLACTONE 25 MG TABS (SPIRONOLACTONE) Take 1 tablet by mouth once a day  #30 x 6   Entered by:   Hoover Brunette, LPN   Authorized by:   Lewayne Bunting, MD, [facility]   Signed by:   Hoover Brunette, LPN on 16/05/9603   Method used:   Electronically to        [facility]* (retail)       [address]       [place], [place]  [address]       Ph: [phone]       Fax: [phone]   RxID:   [id number] LASIX 40 MG TABS (FUROSEMIDE) Take 1 tablet by mouth once a day  #30 x 6   Entered by:   Hoover Brunette, LPN   Authorized by:   Lewayne Bunting, MD, [facility]   Signed by:   Hoover Brunette, LPN on 06/03/2535   Method used:   Electronically to        [facility]* (retail)       [address]       [place], [place]  [address]       Ph: [phone]       Fax: [phone]   RxID:   [id number]

## 2010-09-06 NOTE — Letter (Signed)
Summary: External Correspondence/ [facility]  External Correspondence/ [facility]   Imported By: Dorise Hiss 06/23/2010 10:19:23  _____________________________________________________________________  External Attachment:    Type:   Image     Comment:   External Document

## 2010-10-11 ENCOUNTER — Ambulatory Visit: Payer: Self-pay | Admitting: Family Medicine

## 2010-11-10 LAB — BASIC METABOLIC PANEL
CO2: 27 mEq/L (ref 19–32)
Calcium: 8.7 mg/dL (ref 8.4–10.5)
Chloride: 100 mEq/L (ref 96–112)
GFR calc Af Amer: >60 mL/min (ref >60)
Sodium: 134 mEq/L — ABNORMAL LOW (ref 135–145)

## 2010-11-10 LAB — CBC
HCT: 32.7 % — ABNORMAL LOW (ref 39.0–52.0)
Hemoglobin: 11 g/dL — ABNORMAL LOW (ref 13.0–17.0)
MCHC: 34.8 g/dL (ref 30.0–36.0)
MCHC: 34.9 g/dL (ref 30.0–36.0)
MCV: 98.7 fL (ref 78.0–100.0)
MCV: 98.9 fL (ref 78.0–100.0)
RBC: 3.17 MIL/uL — ABNORMAL LOW (ref 4.22–5.81)
RBC: 3.32 MIL/uL — ABNORMAL LOW (ref 4.22–5.81)
WBC: 6.7 10*3/uL (ref 4.0–10.5)

## 2010-11-10 LAB — URINE DRUGS OF ABUSE SCREEN W ALC, ROUTINE (REF LAB)
Barbiturate Quant, Ur: NEGATIVE
Cocaine Metabolites: NEGATIVE
Creatinine,U: 108.8 mg/dL
Ethyl Alcohol: <10 mg/dL (ref <10)
Methadone: NEGATIVE
Opiate Screen, Urine: NEGATIVE

## 2010-11-10 LAB — COMPREHENSIVE METABOLIC PANEL
AST: 37 U/L (ref 0–37)
BUN: 22 mg/dL (ref 6–23)
CO2: 26 mEq/L (ref 19–32)
Calcium: 8.9 mg/dL (ref 8.4–10.5)
Creatinine, Ser: 1.06 mg/dL (ref 0.4–1.5)
GFR calc Af Amer: >60 mL/min (ref >60)
GFR calc non Af Amer: >60 mL/min (ref >60)
Glucose, Bld: 102 mg/dL — ABNORMAL HIGH (ref 70–99)

## 2010-11-10 LAB — RPR: RPR Ser Ql: NONREACTIVE

## 2010-11-10 LAB — DIFFERENTIAL
Lymphocytes Relative: 17 % (ref 12–46)
Lymphs Abs: 1.4 10*3/uL (ref 0.7–4.0)
Neutro Abs: 5.5 10*3/uL (ref 1.7–7.7)
Neutrophils Relative %: 69 % (ref 43–77)

## 2010-11-10 LAB — URINALYSIS, ROUTINE W REFLEX MICROSCOPIC
Bilirubin Urine: NEGATIVE
Hgb urine dipstick: NEGATIVE
Specific Gravity, Urine: 1.026 (ref 1.005–1.030)
Urobilinogen, UA: 0.2 mg/dL (ref 0.0–1.0)

## 2010-11-10 LAB — BENZODIAZEPINE, QUANTITATIVE, URINE
Flurazepam GC/MS Conf: NEGATIVE
Oxazepam GC/MS Conf: NEGATIVE

## 2010-11-10 LAB — PROPOXYPHENE, CONFIRMATION
Propoxyphene Metabolite: 700 ng/mL
Propoxyphene or Metab. GC/MS: NEGATIVE

## 2010-11-10 LAB — FOLATE: Folate: >20 ng/mL

## 2010-11-10 LAB — URIC ACID: Uric Acid, Serum: 7.7 mg/dL (ref 4.0–7.8)

## 2010-11-10 LAB — TSH: TSH: 1.268 u[IU]/mL (ref 0.350–4.500)

## 2010-11-10 LAB — HEMOGLOBIN A1C: Mean Plasma Glucose: 105 mg/dL

## 2010-11-10 LAB — VITAMIN B12: Vitamin B-12: >2000 pg/mL — ABNORMAL HIGH (ref 211–911)

## 2010-11-10 LAB — IRON AND TIBC: Saturation Ratios: 20 % (ref 20–55)

## 2010-11-13 LAB — BASIC METABOLIC PANEL
BUN: 13 mg/dL (ref 6–23)
BUN: 15 mg/dL (ref 6–23)
BUN: 18 mg/dL (ref 6–23)
CO2: 27 mEq/L (ref 19–32)
CO2: 27 mEq/L (ref 19–32)
Calcium: 7.7 mg/dL — ABNORMAL LOW (ref 8.4–10.5)
Calcium: 8.2 mg/dL — ABNORMAL LOW (ref 8.4–10.5)
Chloride: 94 mEq/L — ABNORMAL LOW (ref 96–112)
Chloride: 95 mEq/L — ABNORMAL LOW (ref 96–112)
Creatinine, Ser: 0.91 mg/dL (ref 0.4–1.5)
Creatinine, Ser: 1.36 mg/dL (ref 0.4–1.5)
GFR calc Af Amer: >60 mL/min (ref >60)
GFR calc non Af Amer: 52 mL/min — ABNORMAL LOW (ref >60)
GFR calc non Af Amer: >60 mL/min (ref >60)
GFR calc non Af Amer: >60 mL/min (ref >60)
Glucose, Bld: 108 mg/dL — ABNORMAL HIGH (ref 70–99)
Glucose, Bld: 109 mg/dL — ABNORMAL HIGH (ref 70–99)
Glucose, Bld: 116 mg/dL — ABNORMAL HIGH (ref 70–99)
Glucose, Bld: 96 mg/dL (ref 70–99)
Potassium: 4.1 mEq/L (ref 3.5–5.1)
Potassium: 4.4 mEq/L (ref 3.5–5.1)
Sodium: 126 mEq/L — ABNORMAL LOW (ref 135–145)
Sodium: 129 mEq/L — ABNORMAL LOW (ref 135–145)

## 2010-11-13 LAB — TYPE AND SCREEN: Antibody Screen: NEGATIVE

## 2010-11-13 LAB — CBC
HCT: 27.7 % — ABNORMAL LOW (ref 39.0–52.0)
HCT: 27.8 % — ABNORMAL LOW (ref 39.0–52.0)
HCT: 30.5 % — ABNORMAL LOW (ref 39.0–52.0)
HCT: 32.5 % — ABNORMAL LOW (ref 39.0–52.0)
Hemoglobin: 10.1 g/dL — ABNORMAL LOW (ref 13.0–17.0)
Hemoglobin: 11.2 g/dL — ABNORMAL LOW (ref 13.0–17.0)
Hemoglobin: 9.6 g/dL — ABNORMAL LOW (ref 13.0–17.0)
Hemoglobin: 9.7 g/dL — ABNORMAL LOW (ref 13.0–17.0)
MCHC: 34.8 g/dL (ref 30.0–36.0)
MCV: 92.6 fL (ref 78.0–100.0)
MCV: 93.8 fL (ref 78.0–100.0)
Platelets: 200 10*3/uL (ref 150–400)
Platelets: 259 10*3/uL (ref 150–400)
Platelets: 271 10*3/uL (ref 150–400)
RBC: 2.98 MIL/uL — ABNORMAL LOW (ref 4.22–5.81)
RBC: 3.11 MIL/uL — ABNORMAL LOW (ref 4.22–5.81)
RBC: 3.46 MIL/uL — ABNORMAL LOW (ref 4.22–5.81)
RDW: 14.7 % (ref 11.5–15.5)
RDW: 14.9 % (ref 11.5–15.5)
RDW: 15.6 % — ABNORMAL HIGH (ref 11.5–15.5)
WBC: 6.2 10*3/uL (ref 4.0–10.5)
WBC: 6.6 10*3/uL (ref 4.0–10.5)

## 2010-11-13 LAB — COMPREHENSIVE METABOLIC PANEL
Albumin: 3.8 g/dL (ref 3.5–5.2)
Alkaline Phosphatase: 54 U/L (ref 39–117)
BUN: 23 mg/dL (ref 6–23)
CO2: 29 mEq/L (ref 19–32)
Chloride: 99 mEq/L (ref 96–112)
Creatinine, Ser: 1.18 mg/dL (ref 0.4–1.5)
GFR calc non Af Amer: >60 mL/min (ref >60)
Glucose, Bld: 99 mg/dL (ref 70–99)
Potassium: 4.7 mEq/L (ref 3.5–5.1)
Total Bilirubin: 0.3 mg/dL (ref 0.3–1.2)

## 2010-11-13 LAB — POCT I-STAT 4, (NA,K, GLUC, HGB,HCT)
Glucose, Bld: 106 mg/dL — ABNORMAL HIGH (ref 70–99)
Hemoglobin: 8.5 g/dL — ABNORMAL LOW (ref 13.0–17.0)
Potassium: 4.4 mEq/L (ref 3.5–5.1)

## 2010-11-13 LAB — URINALYSIS, ROUTINE W REFLEX MICROSCOPIC
Bilirubin Urine: NEGATIVE
Glucose, UA: NEGATIVE mg/dL
Ketones, ur: NEGATIVE mg/dL
Nitrite: NEGATIVE
Protein, ur: NEGATIVE mg/dL
pH: 7 (ref 5.0–8.0)

## 2010-11-13 LAB — OSMOLALITY, URINE: Osmolality, Ur: 566 mOsm/kg (ref 390–1090)

## 2010-11-13 LAB — DIFFERENTIAL
Basophils Absolute: 0 10*3/uL (ref 0.0–0.1)
Basophils Relative: 1 % (ref 0–1)
Lymphocytes Relative: 24 % (ref 12–46)
Monocytes Absolute: 0.7 10*3/uL (ref 0.1–1.0)
Neutro Abs: 4.2 10*3/uL (ref 1.7–7.7)

## 2010-11-13 LAB — APTT: aPTT: 27 seconds (ref 24–37)

## 2010-11-13 LAB — URINE CULTURE: Colony Count: 9000

## 2010-11-13 LAB — PREPARE RBC (CROSSMATCH)

## 2010-11-13 LAB — HEMOGLOBIN AND HEMATOCRIT, BLOOD: Hemoglobin: 10.4 g/dL — ABNORMAL LOW (ref 13.0–17.0)

## 2010-11-13 LAB — PROTIME-INR: Prothrombin Time: 13.2 seconds (ref 11.6–15.2)

## 2010-11-22 ENCOUNTER — Ambulatory Visit: Payer: Self-pay | Admitting: Cardiology

## 2010-12-20 NOTE — Op Note (Signed)
NAMEJAHKEEM, [name]                ACCOUNT NO.:  [id number]   MEDICAL RECORD NO.:  [id number]          PATIENT TYPE:  OIB   LOCATION:  2899                         FACILITY:  [facility]   PHYSICIAN:  Veverly Fells. Excell Seltzer, MD  DATE OF BIRTH:  Dec 29, 1940   DATE OF PROCEDURE:  04/09/2008  DATE OF DISCHARGE:  04/09/2008                               OPERATIVE REPORT   PROCEDURE:  Percutaneous thrombin injection.   INDICATION:  [name] is a 70 year old gentleman, who underwent recent  PCI via the right femoral artery.  This was performed on March 18, 2008.  At his postprocedure followup on April 07, 2008, a right  femoral artery bruit was noted.  An ultrasound demonstrated a common  femoral artery pseudoaneurysm.  Today, this measured 2.5 cm in diameter.  There was a 0.5 cm neck into the pseudoaneurysm.  There was a small  amount of thrombus, but the vast majority of the aneurysm had flow  communicating from the common femoral artery.  I elected to proceed with  percutaneous thrombin injection.   Risks and indications of procedure were reviewed with the patient.  Informed consent was obtained.  The right groin was prepped and draped  under normal sterile technique.  A 5000 units of thrombin was diluted  using normal technique.  An Echotip needle was used to enter the  pseudoaneurysm sac under ultrasound guidance.  Blood return was achieved  and the needle was easily visible into the sac of the pseudoaneurysm and  away from the neck.  A small amount of thrombin was injected and there  was immediate thrombosis of the pseudoaneurysm.  Common femoral and  posterior tibial arterial wave forms remained normal postprocedure.  There was no residual flow into the pseudoaneurysm.  The patient  tolerated the procedure well.  Of note, he was premedicated with 4 mg of  morphine.   FINAL CONCLUSION:  Successful percutaneous thrombin injection for  treatment of right femoral artery postprocedure  pseudoaneurysm.   The patient will be observed for 1 hour and continued on bedrest.  He  will ambulate under observation and then will be ready for discharge to  home.      Veverly Fells. Excell Seltzer, MD  Electronically Signed     MDC/MEDQ  D:  04/09/2008  T:  04/10/2008  Job:  [id number]   cc:   Learta Codding, MD,FACC  Samuel Jester

## 2010-12-20 NOTE — Assessment & Plan Note (Signed)
[facility]                          [facility] OFFICE NOTE   NAME:HODGESJasiri, Hanawalt                       MRN:          [id number]  DATE:01/30/2008                            DOB:          June 18, 1941    REFERRING PHYSICIAN:  Jake Church   REASON FOR CONSULTATION:  Recent hospitalization for chest pain and  palpitations.   HISTORY OF PRESENT ILLNESS:  The patient is a 70 year old male with a  prior history of hypertension as well as significant chronic back  problems.  The patient was recently hospitalized on January 14, 2008 on the  hospitalist service at [facility] with substernal chest pain.  The patient ruled out for myocardial function by enzymes, and Dr.  Doyne Keel ordered an outpatient Cardiolite.  He also had a CT angiogram  done due to elevated D-dimer, but there was no evidence of pulmonary  embolism.  The Cardiolite study was essentially negative for ischemia.  The patient had a normal ejection fraction and no reversible defect.  His echocardiogram also was essentially within normal limits.   The patient now presents for followup.  The patient interestingly  reports a longstanding history of acid reflux, which particularly gets  worse at night or when he eats late.  He has a burning sensation in the  chest as well as a tightness.  His symptoms of chest tightness are  nonexertional; they appear to be mainly happening at rest and in the  evening, possibly related to onset of heartburn.  The patient states  that he is physically very active and actually he states, I feel better  when I exercise.  He reports the chest pain as a squeezing tight  sensation in the lower substernal area without any radiation into the  jaw, neck, or arms.  He received nitroglycerin in the hospital, which  did appear to help.  He also notes occasional palpitations, which are  short lived.  He reports what I feel are premature beats with possible  brief  pauses.  He denies, however, any frank syncope.  There is a  history of syncope possibly 3 years ago, for which he was evaluated at  [facility] in [facility], and no definite cause was established.   PAST MEDICAL HISTORY:  1. Hypertension.  2. Total knee replacements bilaterally.  3. Back surgery with bone fusion.   ALLERGIES:  STATIN DRUG THERAPY intolerant secondary to elevated  enzymes; CODEINE causes itch and rash.   SOCIAL HISTORY:  The patient is a nonsmoker.  Denies any alcohol use.  He lives by himself.   FAMILY HISTORY:  Notable for mother who died of stroke and father from a  heart attack, but no premature CAD.  He has several sisters, 8 in total,  and 4 brothers.  Several sisters had stroke and one brother was born  with heart dropsy.   REVIEW OF SYSTEMS:  As per HPI.  The patient denies any nausea or  vomiting.  No fever or chills.  No melena or hematochezia.  No dysuria  or frequency.  Positive for palpitations.  Positive for chest  pain as  outlined above.   PHYSICAL EXAMINATION:  VITAL SIGNS:  Blood pressure is 149/67, heart  rate is 57, and weight is 221 pounds.  GENERAL:  A well-nourished white male in no apparent distress.  HEENT:  Pupils are equal.  Conjunctivae are clear.  NECK:  Supple.  No thyromegaly.  No carotid bruits.  JVD is 6 cm.  LUNGS:  Clear breath sounds bilaterally.  No wheezing.  HEART:  Regular rate and rhythm with an occasional extrasystole with  normal S1 and S2 and no pathological murmurs.  ABDOMEN:  Soft and nontender, no rebound or guarding, and good bowel  sounds.  EXTREMITIES:  No cyanosis, clubbing, or edema.  NEURO:  The patient is alert, oriented, and grossly nonfocal.   Echocardiogram, normal sinus rhythm with occasional PVCs.   PROBLEM LIST:  1. Palpitations, rule out premature atrial contractions versus      arrhythmia.  2. Substernal chest pain.      a.     Rule out ischemic heart disease (unlikely).      b.      Esophageal reflux disease with esophageal spasm (most       likely).  3. Gastroesophageal reflux disease.  4. History of chronic back pain.   PLAN:  1. The patient's symptoms are atypical for ischemic heart disease      particularly, as they occur at rest and appear to be associated      with reflux.  I suspect the patient has significant esophageal      spasm.  I have asked the patient to take Zantac in the evening.  He      occasionally takes p.r.n. Prilosec. and I have told him to      discontinue this practice for now or unless he wants to take      Prilosec on a daily basis, and he should not take Zantac.  2. I did give the patient p.r.n. nitroglycerin, as this appeared to      have helped him in the hospital.  I have told him that is not      necessarily diagnostic of ischemic heart disease and may ameliorate      his esophageal spasm.  3. Based on the patient's Cardiolite stress study, I think it is      unlikely that he has severe ischemic heart disease, and I have told      him we will apply a cardiac monitor to rule out any significant      arrhythmia.  If this is negative and if his symptomatology      improves, no further workup is required.  However, if his      symptomatology does not improve, certainly catheterization can be      considered at that time.  We discussed it at length in the office      today.    Learta Codding, MD,FACC  Electronically Signed   GED/MedQ  DD: 01/30/2008  DT: 01/31/2008  Job #: [id number]   cc:   Jake Church

## 2010-12-20 NOTE — Assessment & Plan Note (Signed)
[facility]                          [facility] NOTE   Luis, [name]                       MRN:          [id number]  DATE:03/13/2008                            DOB:          May 26, 1941    PRIMARY CARDIOLOGIST:  Learta Codding, MD,FACC.   PRIMARY CARE PHYSICIAN:  Dr. Samuel Jester.   REASON FOR VISIT:  Scheduled followup.   HISTORY OF PRESENT ILLNESS:  I am seeing [name] in Dr. Margarita Mail  absence.  He is a 70 year old gentleman last seen by Dr. Andee Lineman in June.  His history is well detailed in the previous note.  In summary, he has  had problems with intermittent recurrent chest pain described as a mild  pressure.  He has also had intermittent palpitations although without  any frank dizziness or syncope.  In reviewing the available data he  underwent an echocardiogram back in June which demonstrated a normal  left ventricular ejection fraction of 55-60% with normal right  ventricular function and no major valvular abnormalities.  He also had  an inpatient Cardiolite done which showed no frank ischemia.  There was  a fixed inferior defect that was likely attenuation artifact given  normal wall motion in that area and there were no diagnostic  electrocardiographic changes.  He did have premature ventricular  complexes noted.  Further history finds a previous unexplained episode  of possible syncope approximately 3 years ago.  He has had no subsequent  events.  Dr. Andee Lineman had a cardiac monitor placed and I reviewed these  strips today.  Two different arrhythmias are noted.  He had a transient  episode of atrial flutter with controlled heart rate and associated with  some pauses up to 2 and 2-1/2 seconds, sinus bradycardia also noted with  similar pauses.  In addition, there is a single episode of what looks to  be nonsustained ventricular tachycardia.  Cannot entirely exclude an  aberrant supraventricular arrhythmia although this  looks more typical  for a ventricular arrhythmia.  We reviewed these in detail today  including his inpatient testing.  He also tells me parenthetically he  will be undergoing left hip replacement surgery later in the year and  will require cardiac surgical clearance for that.  Dr. Andee Lineman felt that  there may be a component of reflux and esophageal spasm when he last saw  him.  The patient has actually been taking Maalox three times a day and  feels as though some of his symptoms have improved actually.  Even  palpitations to some degree.  He is not compliant with CPAP and also  tells me that he stopped hydrochlorothiazide and cut his labetalol to  once daily given concerns about slow heart rate and findings via  laboratory assessment that his sodium was low at 131, now up to 134 by  followup blood work on July 28.  BUN and creatinine at that time were 19  and 1.3 and potassium was 4.0.  We discussed the situation and my  feeling is that we should be very clear in excluding underlying  obstructive heart  disease, particularly given his ventricular arrhythmia  in light of the fact that his left ventricular function is normal.  He  also does continue to have mild chest pressure.  We reviewed the  potential risks and benefits of the diagnostic cardiac catheterization  and he is in agreement to proceed.  This will be scheduled for next  week.  If in fact he does not have any obstructive stenoses then the  next step would be referral to electrophysiology and further medication  adjustments.   ALLERGIES:  CODEINE.   MEDICATIONS:  1. Recently, potassium chloride 10 mg p.o. daily.  2. Alprazolam 1 mg p.o. daily at bedtime.  3. Hydralazine 50 mg p.o. t.i.d.  4. Diclofenac 50 mg p.o. b.i.d.  5. Lotrel 10/40 mg p.o. daily.  6. Labetalol 200 mg p.o. daily.  7. Niacin 5 mg 2 tablets p.o. q. a.m. and 2 tablets p.o. q. p.m.  8. Red yeast rice extract.  9. Centrum Silver daily.  10.Iron  supplements, folic acid, vitamin D and vitamin B12.  11.He also uses alprazolam 1 mg p.r.n.  12.He has used Prilosec p.r.n.   REVIEW OF SYSTEMS:  As outlined above.  Otherwise negative.   PAST MEDICAL HISTORY, SOCIAL HISTORY AND FAMILY HISTORY:  All recently  reviewed in Dr. Margarita Mail note from June 25.  No other interval change  noted with the exception of recent evaluation by Dr. Madelon Lips in  [place] regarding left hip arthritis and need for potential left hip  replacement later this year.   PHYSICAL EXAMINATION:  VITAL SIGNS:  On examination blood pressure is  164/76, heart rate is 71, weight is 197 pounds.  GENERAL:  The patient is comfortable and in no acute distress.  HEENT:  Conjunctivae normal.  Oropharynx is clear.  NECK:  Neck is supple.  No elevated jugular venous pressure.  No loud  bruits.  No thyromegaly.  LUNGS:  Lungs are clear without labored breathing.  CARDIOVASCULAR:  Exam reveals a regular rate and rhythm.  No loud  systolic murmur or S3 or gallop.  ABDOMEN:  Soft, nontender.  EXTREMITIES:  Exhibit trace edema.  Distal pulses are 2+.  SKIN:  Warm and dry.  MUSCULOSKELETAL:  No kyphosis noted.  NEUROPSYCHIATRIC:  The patient is alert and oriented x3.  Affect is  appropriate.   IMPRESSION AND RECOMMENDATIONS:  History of palpitations and  intermittent mild chest pressure with noninvasive cardiac assessment  documenting normal left and right ventricular systolic function with no  major valvular abnormalities and no clear evidence of ischemia based on  testing in June.  Despite this, symptoms continue and he now has  CardioNet monitor evidence of transient atrial flutter, some sinus node  dysfunction with bradycardia and pauses up to 2-1/2 seconds and also  what looks to be an episode of nonsustained ventricular tachycardia.  To  make sense of all this we discussed proceeding with a diagnostic cardiac  catheterization to clearly exclude any underlying  obstructive coronary  artery disease.  If he does have significant stenoses, perhaps  revascularization might have a positive impact on his dysrhythmias.  Medical therapy can then be further titrated.  If he does not have  obstructive disease, I would think referral to electrophysiology would  then be the next step.  We will plan to follow up blood work and arrange  a cardiac catheterization for next week.  I have asked him to start back  on a coated aspirin a day and otherwise continue his medicines  until we  have more information.  Further plans to follow.     Jonelle Sidle, MD  Electronically Signed    SGM/MedQ  DD: 03/13/2008  DT: 03/13/2008  Job #: [id number]   cc:   Raiford Noble, MD,FACC

## 2010-12-20 NOTE — Cardiovascular Report (Signed)
NAMEEDUAR, [name]                ACCOUNT NO.:  [id number]   MEDICAL RECORD NO.:  [id number]          PATIENT TYPE:  OIB   LOCATION:  1965                         FACILITY:  [facility]   PHYSICIAN:  Bevelyn Buckles. Bensimhon, MDDATE OF BIRTH:  12/06/1940   DATE OF PROCEDURE:  DATE OF DISCHARGE:  03/18/2008                            CARDIAC CATHETERIZATION   PRIMARY CARE PHYSICIAN:  Samuel Jester, MD   REFERRING PHYSICIAN:  Jonelle Sidle, MD   PATIENT IDENTIFICATION:  [name] is a 69 year old male who has been  having intermittent chest pain and palpitations.  A Cardiolite in June  showed an EF of 55-60% with no significant ischemia.  He has significant  high blood pressure as well as hyperlipidemia.  He has been somewhat  intolerant to statins in the past due to an elevated CK level.  Given  his symptoms, he was referred for diagnostic angiography.   PROCEDURES PERFORMED:  1. Selective coronary angiography.  2. Left heart cath.  3. Left ventriculogram.  4. Abdominal aortogram to evaluate his renal arteries.   DESCRIPTION OF PROCEDURE:  The risks and indication were explained.  Consent was signed and placed on a chart.  A 4-French arterial sheath  was placed in the right femoral artery using a modified Seldinger  technique.  Standard catheters including a JL-4, 3-D RCA and angled  pigtail were used for procedure.  All catheter exchanges made over wire.  There is no apparent complications.   Left main was normal.   LAD was a long vessel coursing to the apex gave off a large branching  diagonal in the midsection.  There is a 40% proximal LAD lesion, a  tubular 30% LAD lesion in the midsection and a 60% lesion in the lower  portion of the diagonal branch.   Circumflex gave off a large OM-1, a small OM-2, a small OM-3, there is  20% lesion in the mid AV groove circ and a 40% lesion in the OM.   Right coronary artery was a large dominant vessel gave off a PDA and a  posterolateral.  There was a 99% lesion in the proximal RCA, 30% lesion  in the midsection and a 60% lesion in the PDA.   Left ventriculogram done in the RAO position showed an EF of 55% with no  regional wall motion abnormalities.   Abdominal aortogram showed patent renal arteries bilaterally.  We did  have some problems utilizing the right renal artery but it did appear  widely patent.  There was no abdominal aortic aneurysm.   ASSESSMENT:  1. Three-vessel coronary artery disease with high-grade proximal right      coronary artery lesion.  Otherwise, nonobstructive disease.  2. Normal left ventricular function.  3. Patent renal arteries.   PLAN/DISCUSSION:  Mr. Beals will need intervention on his right  coronary artery.  I had a long talk with him about the procedure.  He  did receive some mild sedation prior to discussing the trials but in my  discussion with him, it was clear that he was able to understand the  trials well including  the risks and benefits.      Bevelyn Buckles. Bensimhon, MD  Electronically Signed     DRB/MEDQ  D:  03/18/2008  T:  03/19/2008  Job:  [id number]

## 2010-12-20 NOTE — Assessment & Plan Note (Signed)
[facility]                          [facility] NOTE   [name], [name]                       MRN:          [id number]  DATE:04/07/2008                            DOB:          Jun 04, 1941    CARDIOLOGIST:  Learta Codding, MD,FACC   PRIMARY CARE PHYSICIAN:  Samuel Jester   REASON FOR VISIT:  Posthospitalization followup.   HISTORY OF PRESENT ILLNESS:  Mr. [name] is a very pleasant 70 year old  male patient with a history of hypertension and significant arthritis,  who recently saw Dr. Diona Browner in followup.  The patient continued to  have chest pressure in light of a recent Cardiolite study that was low  risk.  He also had evidence of transient atrial flutter, sinus node  dysfunction with bradycardia and 2.5 seconds pauses and some  nonsustained ventricular tachycardia.  It was decided to proceed with  cardiac catheterization to further evaluate his symptoms.  This was done  on March 18, 2008, by Dr. Gala Romney.  This revealed scattered moderate  nonobstructive disease in the LAD and circumflex.  He had a 99% lesion  in the proximal RCA.  His EF was 55%.  His renal arteries were patent  bilaterally and no abdominal aortic aneurysm was noted.  He was further  referred for PCI of the RCA.  Dr. Clifton James placed a PLATINUM drug-  eluting stent into the RCA.  The patient returns to the office today for  followup.  He has multiple questions in the office today regarding his  diagnosis.  He would like to proceed with a second monitor to reassess  his rhythm.  He says his palpitations are improved, but still present.  He denies any of the chest tightness that he was having previously.  He  does occasionally have some chest discomfort that may be related to  meals.  He does take omeprazole on occasion as needed.  He denies  orthopnea or PND.  He does have pedal edema.  This seems to be chronic  since he was started on Lotrel.  He denies  syncope.   CURRENT MEDICATIONS:  Hytrin 5 mg daily, Plavix 75 mg daily, Magnesium  plus zinc, Aspirin 325 mg daily, Potassium 10 mEq daily,  Alprazolam 1 mg nightly, CPAP nightly, Hydralazine 50 mg 3 times a day,  Diclofenac 50 mg 2 tablets b.i.d.,  Lotrel 10/40 mg daily, Labetalol 10 mg nightly, Niacin 500 mg 2 tablets  b.i.d., Red yeast rice, Centrum Silver, Iron, Folic acid, Vitamin D,  Vitamin B12, Omeprazole p.r.n.   PHYSICAL EXAMINATION:  GENERAL:  He is a well-nourished, well-developed  male.  VITAL SIGNS:  Blood pressure is 159/69.  Pulse is 63, weight 202 pounds.  HEENT:  Normal.  NECK:  Without JVD.  CARDIAC:  Normal S1 and S2.  Regular rate and rhythm.  LUNGS:  Clear to auscultation bilaterally.  ABDOMEN:  Soft and nontender.  EXTREMITIES:  Without edema.  NEUROLOGIC:  He is alert and oriented x3.  Cranial nerves II through XII  are grossly intact.  VASCULAR:  Right femoral arteriotomy site with pounding pulse  and  positive bruit.   ASSESSMENT/PLAN:  1. Coronary artery disease as outlined above.  He is status post      PLATINUM drug-eluting stent placement to the RCA.  He does have      residual 40% proximal, 30% mid LAD stenosis, and 60% stenosis in      the diagonal branch, 40% stenosis in the obtuse marginal and 60%      stenosis in the PDA.  He will require aspirin and Plavix.  Plavix      should be continued for a minimum of 1 year.  He was planning on      having hip surgery for his arthritis.  However, this will need to      be placed on hold for at least a year secondary to the need to      continue aspirin and Plavix as outlined.  2. Transient atrial flutter as well as nonsustained ventricular      tachycardia and evidence of sinus node dysfunction on recent      cardiac monitoring.  Dr. Diona Browner felt that the patient would need      to be referred to Electrophysiology should he have no      revascularization performed as a result of his cardiac       catheterization.  In light of his recent revascularization and      continued palpitations, we will proceed with relook monitor.  I      have discussed this with Dr. Andee Lineman, who agrees.  Further      recommendations to follow once we know the results of his repeat      monitor.  3. Hypertension.  This is somewhat uncontrolled.  He had some problems      with hypokalemia in the past.  A question whether or not he may      have hyperaldosteronism.  I have asked him to stop his      hydrochlorothiazide as well as his potassium and start on generic      Maxzide 37.5/25 mg half tablet daily.  We will check blood work in      a week to include BMET.  4. Fatigue.  The patient complains of some fatigue.  He thinks it is      related to his inactivity.  We will refer him back to cardiac      rehab.  The patient will have a CBC and TSH checked with his blood      work in 1 week.  5. Right groin bruit, status post catheterization.  The patient will      be set up for an ultrasound to rule out pseudoaneurysm.  6. GERD. I have asked the patient to stop using a PPI secondary to      potential interaction with Plavix.  He has been asked to use an H2      Blocker as needed in the future.   DISPOSITION:  The patient will be brought back in followup with Dr.  Andee Lineman in the next 6-8 weeks to review the above testing.  He knows to  return sooner p.r.n.   Of note, in the future, the patient should be seen by Dr. Andee Lineman (or  another physician) as he prefers to see a physician rather than a  physician assistant.   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  He has been  set up  for an outpatient thrombin  injection with Dr. Tonny Bollman Thursday, April 09, 2008 at [facility].  This has been explained to the patient.      Tereso Newcomer, PA-C  Electronically Signed      Learta Codding, MD,FACC   Electronically Signed   SW/MedQ  DD: 04/07/2008  DT: 04/08/2008  Job #: [id number]   cc:   Samuel Jester

## 2010-12-20 NOTE — Op Note (Signed)
NAMEJACORI, [name] NO.:  [id number]   MEDICAL RECORD NO.:  [id number]          PATIENT TYPE:  INP   LOCATION:  2019                         FACILITY:  [facility]   PHYSICIAN:  Dyke Brackett, M.D.    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   OPERATION:  Left total hip replacement (Prodigy XL stem) +12 mm neck  length with 56-mm acetabulum and Gription +4 mm acetabulum with 10-  degree lip liner, size 13.5-mm stem, small.   SURGEON:  Dyke Brackett, MD   ASSISTANTKatrinka Blazing, PA.   BLOOD LOSS:  Approximately 600.   DESCRIPTION OF PROCEDURE:  Sterile prep and drape, lateral position,  posterior approach was made.  Note in the record was made, the patient  had a preoperative footdrop and previous back surgery, on the left leg.  He was approached through the posterior approach with __________ fascia  lata, gluteus maximus followed by identification of short external  rotators, cutting them, T-capsulotomy.  Significant synovitis was  resected.  A lot of the capsule had to be resected, so it could not  really be closed.  Acetabulum again had a lot of debris.  The head had a  severe head deformity with erosion of the posterior-superior acetabulum.  This required [place] to bring the oblong acetabulum laid anatomically.  We  could not go to the depth until the defect completely due to the  erosions.  However, once we centralized the acetabulum, it was noted  while we had gone down to the inner table.  It was not violated and he  had a good anterior column and originally a good posterior column.  It  was elected to [facility] about 2 mm to obtain better Press-Fit and used  a Gription cup with multiple holes.  Three screws were placed in  good  bone, one screw with only fair purchase.  This was due to the fact when  the cup was placed in the correct anteversion and abduction due to the  migration.  There was probably 25-30% of the superior-posterior part of  the cup.  It was not covered by bone, but this was due to the previously  mentioned anatomic deformity.  However, the cup was extremely stable.   Prior to that the femur was then cut about 1 fingerbreadth above the  lesser troch with progressive rasping and reaming up to accept a 35-mm  new XL stem with a 0.5-mm under-ream.  Trial broach was next placed and  the trial reduction carried out with the above-mentioned acetabular  sizes.  Unfortunately, at the upper limit of the neck length and the  Prodigy stem, the hip was completely stable with no tendency of  dislocation.  Leg lengths were noted to be  equal.  Final components were inserted after irrigation and again  stability deemed to be  excellent with good Press-Fit followed by closure  of the iliotibial band with a running Ethibond, 2-0 Vicryl, skin clips,  Marcaine with epinephrine to the skin.  Adequate sterile dressing was  applied.      Dyke Brackett, M.D.  Electronically Signed     WDC/MEDQ  D:  02/24/2009  T:  02/25/2009  Job:  [id number]

## 2010-12-20 NOTE — Assessment & Plan Note (Signed)
[facility]                          [facility] NOTE   NAME:[name], [name]                       MRN:          [id number]  DATE:03/09/2009                            DOB:          [birth year]    REFERRING PHYSICIAN:  Samuel Jester   HISTORY OF PRESENT ILLNESS:  The patient is a very pleasant 70 year old  male with a history of severe single-vessel coronary artery disease and  drug-eluting stenting in 2009.  The patient recently underwent hip  replacement on the left side.  He has had difficulty ambulating since  his surgery and has made slow progress.  He has developed lower  extremity edema and particularly swelling in the left leg and was  referred for DVT evaluation with venous Doppler study at [facility]  [facility].  Reportedly, the study was negative, but we were requested to  see the patient due to significant peripheral edema and questionable  volume overload.  Next, the patient states that he has no chest pain,  also he has no shortness of breath.  He still has difficulty ambulating  because of his recent hip surgery and walks with a walker.  He has  noticed lower extremity edema, particularly in left leg, although he has  no edema above the knee in both legs.  The patient denies any orthopnea,  PND, palpitations, or syncope.   MEDICATIONS:  1. Pantoprazole sodium 20 mg p.o. b.i.d.  2. Terazosin 5 mg p.o. nightly.  3. Benazepril 40 mg p.o. daily.  4. Isosorbide mononitrate 30 mg in the morning.  5. Hydralazine 25 mg q.i.d.  6. Desipramine 50 mg nightly.  7. Plavix 75 mg p.o. daily.  8. Methocarbamol 500 mg p.r.n.  9. Candesartan 4 mg p.o. p.r.n.  10.Aspirin 81 mg p.o. daily.  11.Labetalol 300 mg three times a day.  12.Alprazolam 1 mg p.o. nightly.  13.Levaquin 750 mg p.o. daily.  14.Triamterene and hydrochlorothiazide 37.5/25 mg p.o. daily.  15.Vitamin D.   PHYSICAL EXAMINATION:  VITAL SIGNS:  Blood pressure 131/68, heart  rate  60, weight is 192 pounds.  GENERAL:  A well-nourished white male in no apparent distress.  HEENT:  Pupils anisocoric.  Conjunctiva clear.  NECK:  Supple.  Normal carotid upstroke and no carotid bruit.  LUNGS:  Clear breath sounds bilaterally.  HEART:  Regular rate and rhythm with normal S1 and S2.  No S3 or S4.  No  pathological murmurs.  ABDOMEN:  Soft, nontender.  No rebound or guarding.  Good bowel sounds.  EXTREMITIES:  No cyanosis or clubbing.  There is 2+ edema in the left  leg and 1-2+ edema in the right leg.  At the level of the ankle, there  is significant edema.  NEUROLOGIC:  The patient is alert, oriented, and grossly nonfocal.   PROBLEM LIST:  1. Lower extremity edema.  Questionable venous stasis status post      recent hip surgery (unlike to significant volume overload).  2. Palpitations, resolved.  3. Normal LV function.  4. Single-vessel coronary artery status post drug-eluting stenting to      the right  coronary artery 10 months ago.  5. Systolic hypertension, controlled.  6. Dyslipidemia.  7. Anxiety and depression, improved.  8. Hyperkalemia, resolved.  9. 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   PLAN:  1. I am not entirely sure if the patient truly volume overload.      Nevertheless, I start him on a small prescription of Lasix for 5      days and also to apply compression stockings.  2. We will have a CBC and CMET done in 1 week.  He will also have an      RN visit.  3. I do not think that the patient has heart failure at this point.  I      do not think a further cardiac workup is acutely necessary.     Learta Codding, MD,FACC  Electronically Signed    GED/MedQ  DD: 03/09/2009  DT: 03/10/2009  Job #: [id number]   cc:   Samuel Jester

## 2010-12-20 NOTE — Discharge Summary (Signed)
NAMECACE, OSORTO                ACCOUNT NO.:  [id number]   MEDICAL RECORD NO.:  [id number]          PATIENT TYPE:  INP   LOCATION:  2918                         FACILITY:  [facility]   PHYSICIAN:  Verne Carrow, MDDATE OF BIRTH:  July 23, 1941   DATE OF ADMISSION:  03/18/2008  DATE OF DISCHARGE:  03/19/2008                               DISCHARGE SUMMARY   PRIMARY CARDIOLOGIST:  Learta Codding, MD, [facility].   PRIMARY CARE Wilhelmena Zea:  Dr. Samuel Jester.   DISCHARGE DIAGNOSIS:  Unstable angina.   SECONDARY DIAGNOSES:  1. Coronary artery disease.  2. Hypertension.  3. Hyperlipidemia with a history of statin intolerance secondary to      elevated LFTs.  4. Obesity.  5. Status post bilateral total knee replacement.  6. History of back surgery with bone fusion.  7. History of nonsustained ventricular tachycardia noted on      monitoring.  8. Paroxysmal atrial flutter.  9. Obstructive sleep apnea with intermittent continuous positive      airway pressure usage.   ALLERGIES:  Intolerance to statins secondary to elevated LFTs, CODEINE  causes itching and rash.   PROCEDURE:  Left heart cardiac catheterization with successfully PCI and  stenting of the proximal right coronary artery with placement of 3.0 x  12 mm platinum study stent.   HISTORY OF PRESENT ILLNESS:  A 70 year old Caucasian male with a history  of intermittent substernal chest discomfort previously evaluated by  stress testing in June 2009, showing a fixed inferior defect which was  felt to be attenuation artifact and no evidence of ischemia.  Unfortunately, he has continued to have intermitted episodes of chest  discomfort.  He saw Dr. Diona Browner on March 13, 2008.  It was felt that  the patient will benefit from cardiac catheterization to further  evaluate cardiac anatomy.   HOSPITAL COURSE:  The patient presented to [facility] for cardiac  catheterization on March 18, 2008.  Catheterization revealed a 99%  stenosis  in the proximal right coronary artery with otherwise  nonobstructive disease throughout his left coronary tree.  EF was 65%  without regional wall motion abnormalities.  Films were reviewed and the  patient underwent successful PCI and stenting of the proximal right  coronary artery with placement of a 3.0 x 12 mm platinum study drug-  eluting stent.  He tolerated this procedure well and postprocedure, he  has been ambulating without recurrent symptoms or limitations.  He is  being discharged home today in good condition.   DISCHARGE LABS:  Hemoglobin 11.6, hematocrit 33.6, WBC 6.6, and  platelets 273.  Sodium 138, potassium 3.9, chloride 104, CO2 28, BUN 11,  creatinine 0.98, glucose 87, and calcium 8.5.  Hemoglobin A1c 5.2.  CK  216 and MB 6.7.  Troponin-I 0.04.  HDL cholesterol 28.   DISPOSITION:  The patient is being discharged home today in good  condition.   FOLLOWUP APPOINTMENTS:  We will arrange followup with Dr. Andee Lineman on  April 07, 2008, at 2:15 p.m.  He has a followup with Dr. Samuel Jester as previously scheduled.   DISCHARGE MEDICATIONS:  1. Aspirin 325 mg daily.  2. Plavix 75 mg daily.  3. Lotrel 10/40 mg nightly.  4. Labetalol 200 mg nightly.  5. Hydralazine 50 mg t.i.d.  6. Hytrin 5 mg daily.  7. Prilosec 20 mg daily.  8. Niacin 1000 mg b.i.d.  9. Multivitamin 1 daily.  10.Magnesium 400 mg daily.  11.Diclofenac 50 mg b.i.d.  12.Xanax 1 mg p.r.n. nightly.  13.Nitroglycerin 0.4 mg sublingual p.r.n. chest pain.  14.Potassium chloride 10 mEq daily.  15.Red yeast rice  b.i.d.  16.Imdur 30 mg half tablet daily.   OUTSTANDING LABORATORY STUDIES:  None.   DURATION OF DISCHARGE/ENCOUNTER:  40 minutes including physician time.      Nicolasa Ducking, ANP      Verne Carrow, MD  Electronically Signed    CB/MEDQ  D:  03/19/2008  T:  03/20/2008  Job:  [id number]   cc:   Samuel Jester

## 2010-12-20 NOTE — Assessment & Plan Note (Signed)
[facility]                          [facility] OFFICE NOTE   [name], [name]                       MRN:          [id number]  DATE:09/25/2008                            DOB:          1941/03/25    REFERRING PHYSICIAN:  Samuel Jester   HISTORY OF PRESENT ILLNESS:  The patient is a 70 year old male with  history of single-vessel coronary artery disease.  The patient has  normal LV function.  He does have difficulty to control hypertension.  Also, recently he was hyperkalemic and he stopped his potassium  supplements.  Overall, he is doing pretty well.  There is no substernal  chest pain or shortness of breath.  He feels occasional palpitations,  but rarely.  He has some problems with nocturia and I advised him to see  his urologist.   MEDICATIONS:  See chart, this is extensive list.   PHYSICAL EXAMINATION:  VITAL SIGNS:  Blood pressure 176/64 and heart  rate is 70 beats per minute.  Weight is not measured.  HEENT:  Pupils/conjunctivae clear.  NECK:  Supple.  Normal carotid upstroke.  No carotid bruits.  LUNGS:  Clear breath sounds bilaterally.  HEART:  Regular rate and rhythm.  Normal S1 and S2.  No murmurs, rubs,  or gallops.  ABDOMEN:  Soft and nontender.  No rebound or guarding.  Good bowel  sounds.  EXTREMITIES:  No cyanosis, clubbing, or edema.  NEUROLOGIC:  The patient is alert, oriented, grossly nonfocal.   PROBLEM LIST:  1. Palpitations, improved.  2. Normal left ventricular function.  3. Severe symptoms of coronary artery disease, status post DES stent      to the right coronary artery.  4. Systolic hypertension, uncontrolled.  5. Dyslipidemia.  6. Anxiety/depression.  7. Hyperkalemia, resolved.   PLAN:  1. The patient needs better blood pressure control.  I increased his      labetalol to 300 mg p.o. t.i.d.  2. The patient will follow his blood pressure readings carefully.  3. Asked him to either discuss with Dr.   Charm Barges or go see an urologist      regarding possibility of an overflow bladder.     Learta Codding, MD,FACC  Electronically Signed    GED/MedQ  DD: 09/25/2008  DT: 09/26/2008  Job #: [id number]   cc:   Samuel Jester

## 2010-12-20 NOTE — Consult Note (Signed)
[name], [name]                ACCOUNT NO.:  [id number]   MEDICAL RECORD NO.:  [id number]          PATIENT TYPE:  INP   LOCATION:  2019                         FACILITY:  [facility]   PHYSICIAN:  [name], [name], [name] OF BIRTH:  02-02-1941   DATE OF CONSULTATION:  DATE OF DISCHARGE:                                 CONSULTATION   [name] is a 70 year old patient of Dr. Lewayne Bunting.  He is admitted  to hospital by Dr. Madelon Lips for left hip replacement.   The patient has a history of coronary artery disease.  He had a drug-  eluting stent to the proximal right coronary artery in August 2009 and  Dr. Andee Lineman recommended that the patient wait at least 10 months prior to  hip surgery.  He has had bilateral knee surgery and multiple back  surgeries.   He has not had any significant chest pain.  He had a Lexicon Myoview  done in June 2010 which was nonischemic.   The patient is in the postop.  After left hip surgery, we are asked to  see him in regards to his coronary artery disease and relative  bradycardia.  I noted that his home medications have not been written  for.  He had been on fairly high-dose labetalol at 300 mg t.i.d. prior  to his operation.  Dr. Andee Lineman notes indicated that this helped with his  PVCs.   As far as I can tell his postop course has been unremarkable.  His  hemoglobin is 10.1.  He is not having chest pain.  He has relative  bradycardia and has been placed on telemetry.  His heart rate is sinus  rhythm to sinus bradycardia at a rate of 54-57.  There is no evidence of  heart block or prolonged pauses.   The patient's Plavix was stopped about 7 days prior to the surgery.  His  10-point review of systems otherwise negative.  It is only remarkable  for minimal nausea and some left hip pain.  He is able to control his  Dilaudid pump.   All other systems reviewed and negative.  In particular, he has not had  any diaphoresis, chest pain, palpitations, or  shortness of breath.   He does seem somewhat fearful about being on both Lovenox and Plavix and  I tried to explain to him that there are antiplatelet antithrombin  activities that are different and that Lovenox is strictly for DVT  prophylaxis.   His family history is noncontributory.   PAST MEDICAL HISTORY:  Remarkable for hypertension, hyperlipidemia,  anxiety and depression, history of back pain with severe osteoarthritis  and multiple surgeries status post left hip surgery this admission,  previous bilateral total knee replacements, and history of obstructive  sleep apnea.   He has intolerances to STATINS from elevated LFTs and CODEINE causes  itching and rash   He also has had a history of paroxysmal atrial flutter.   The patient is disabled.  He does all activities of daily living.  He  does not smoke or drink.   The patient's medications  prior to admission included:  1. Terazosin 5 mg a day.  2. Alprazolam 1 mg once 2 tablets nightly.  3. Diclofenac 50 mg tablets 2 tablets b.i.d.  4. Centrum.  5. Folic acid.  6. Omeprazole 10 mg a day.  7. Desipramine 50 mg a day.  8. Hytrin 5 mg a day.  9. Plavix 75 a day which was held 7 days prior to the surgery.  10.Magnesium zinc and aspirin a day.  11.Triamterene and hydrochlorothiazide.  12.Vitamin D.  13.Labetalol 300 t.i.d.  14.Hydralazine 60 q.i.d.  15.Isosorbide 30 mg p.o. daily.  16.Benazepril 40 mg day.   PHYSICAL EXAMINATION:  GENERAL:  Remarkable for elderly male in mild  distress from his hip surgery.  VITAL SIGNS:  His blood pressure is 138/59, pulse 57 and regular,  respirations 18, afebrile, and 93% sat on room air.  HEENT:  Unremarkable.  NECK:  Carotids are normal without bruit.  No lymphadenopathy,  thyromegaly, or JVP elevation.  LUNGS:  Clear with good diaphragmatic motion.  No wheezing.  S1 and S2.  Normal heart sounds.  PMI normal.  ABDOMEN:  Benign.  Bowel sounds positive.  No hepatosplenomegaly,   hepatojugular reflux, or tenderness.  EXTREMITIES:  Status post left hip surgery.  Distal pulses are intact.  No edema.  NEURO:  Nonfocal.  SKIN:  Warm and dry.  MUSCULOSKELETAL:  No muscular weakness.   EKG has not been done yet.  Telemetry shows sinus bradycardia, sinus  rhythm with no junctional rhythm and no heart block.  His most recent  hemoglobin was 10.4.   IMPRESSION:  1. History coronary artery disease, status post drug-eluting stent to      the right coronary artery:  Plavix is stopped for 7 days.  I will      write him for a baby aspirin at 81 mg to start in conjunction with      his Lovenox for DVT prophylaxis.  We will resume his Plavix when it      is okay with surgery.  He will have an EKG in the a.m. and is      currently not having any angina.  2. Relative bradycardia:  This is asymptomatic.  He was on high-dose      labetalol prior to the surgery.  I will hold his labetalol for the      time being and only reinstitute it when his heart rate is above 65.  3. History of hypertension:  On multiple drugs.  These were not      written for in the postop period, in particular his ACE inhibitor.      I will write him back for some of these drugs depending on his      systolic blood pressure.  4. Anxiety and depression:  I will write for his desipramine as I do      not him to withdrawal from this drug in the postop period and      apparently he had significant depression prior to restarting his      desipramine.   We would be happy to follow him along, however, I do not think he will  need telemetry for more than 24-48 hours.      [name], [name], [name]  Electronically Signed     PCN/MEDQ  D:  02/24/2009  T:  02/25/2009  Job:  [id number]

## 2010-12-20 NOTE — Discharge Summary (Signed)
NAMEREACE, BRESHEARS                ACCOUNT NO.:  [id number]   MEDICAL RECORD NO.:  [id number]          PATIENT TYPE:  INP   LOCATION:  5001                         FACILITY:  [facility]   PHYSICIAN:  Dyke Brackett, M.D.    DATE OF BIRTH:  1940/12/29   DATE OF ADMISSION:  02/24/2009  DATE OF DISCHARGE:  03/01/2009                               DISCHARGE SUMMARY   ADMITTING DIAGNOSIS:  Left hip osteoarthritis.   DISCHARGE/FINAL DIAGNOSIS:  Left hip osteoarthritis, status post left  total hip replacement.   PROCEDURAL NOTE:  Mr. Thrall presented to the hospital with left hip  osteoarthritis, brought into the OR and left total hip replacement was  performed with no complications.  About 800 mL of blood loss.  Transferred to the PACU in stable condition after being given 1 unit of  packed red blood cells and transferred to telemetry floor on 2000 in  stable condition. To be followed by cardiology as there was,  postoperatively, a cardiology consult done with [facility].   HOSPITAL COURSE:  Mr. Vaneaton is a 70 year old male status post left  total hip replacement. Cardiology to help follow as had recent stent  placement. First postoperative day, the patient was doing well, just had  some ingestion.  Hemoglobin was 8.6.  Cardiology would prefer him to be  over 10, so he was transfused 2 units of packed red blood cells and  cardiology continued to follow.  The patient was on Lovenox  postoperatively.  Postop day #2, the patient having some swelling in his  hands, noted that there was no KVO of his fluid, therefore we went to  saline lock.  Hemoglobin is at 10.1 and we did a fluid restriction for  lower sodium at 129 of 1000 mL fluid restriction for the day.  Continued  to work with physical therapy.  The patient was also continued to be  followed by cardiology.  They recommended aspirin plus Lovenox for his  DVT prophylaxis and we agreed with giving an 81 mg aspirin per day.  Postoperative day #3, followed by ortho cover, things going well. Plavix  be started when okay ortho and we were agreeable to that.  Planned for  him to go home.  He is doing well from orthopedic standpoint,  continued  to be followed by cardiology.  Did have some lower sodium which is  improving, it got as low as 126, hemoglobin at 9.6.  Plavix resumed and  fluid restriction helped with his low sodium.  Postoperative day #4,  ortho coverage continued by Dr. Dion Saucier.  Continued on fluid restriction  for his low sodium. The patient still weightbearing as tolerated,  continued to be followed by cardiology.   On postoperative day #6, March 01, 2009, the patient had a recent fall  this morning on his left hip disease as slipped with no socks on and  fell directly on that hip.  No head injury, no loss of consciousness.  Did x-rays of his hip which showed no acute process, no acute fractures,  good replacement of his total hip replacement.  I reviewed the x-ray in  addition to the radiologist's report.  Hemoglobin is at 10.5, sodium has  improved to 131.  Vital signs stable, afebrile.   Pending clearance from cardiology to go to skilled nursing facility,  will plan for discharge today on March 01, 2009, weightbearing as  tolerated.   ASSESSMENT/PLAN:  Mr. Doxtater is a 70 year old male status post left  total hip replacement, discharged to skilled nursing facility,  weightbearing as tolerated.  Plan for 6 weeks postoperatively when in  bed using a knee immobilizer help prevent any hip dislocation.  Posterior hip precautions.  For anticoagulation, he is going to be on  Lovenox 40 mg subcu per day at 8 a.m. each day in addition to aspirin 81  mg and also started back on Plavix 75 mg p.o. daily per cardiology.  Plan to discharge today on March 01, 2009 as long as okay with cardiology  as that is the only thing we are awaiting.   DISCHARGE MEDICATIONS:  1. Lovenox 40 mg subcu at 8 a.m. each day for  a total of 15 days      postoperatively.  2. Percocet 5/325 mg 1-2 tablets p.o. q.4-6 hours p.r.n. pain.  3. Folic acid 1 mg tablet p.o. daily  4. Protonix 20 mg tablet p.o. b.i.d.  5. Hytrin 5 mg p.o. q.h.s.  6. Benazepril 40 mg p.o. daily.  7. Isosorbide mononitrate 15 mg p.o. daily  8. Hydralazine 25 mg tablet p.o. q.i.d.  9. Desipramine 50 mg tablet p.o. q.h.s.  10.Labetalol 300 mg tablet p.o. b.i.d.  11.Plavix 75 mg tablet p.o. daily  12.Robaxin 500 mg p.o. q.6 hours p.r.n. pain.  13.Zofran 4 mg tablets p.o. q.6 hours p.r.n. nausea.   The patient needs to be seen in Dr. Madelon Lips office postoperatively, call  number [phone].  He should be seen 14 days postoperatively to have his  staples removed.      Sharol Given, PA      Dyke Brackett, M.D.  Electronically Signed    JBS/MEDQ  D:  03/01/2009  T:  03/01/2009  Job:  [id number]

## 2010-12-20 NOTE — Cardiovascular Report (Signed)
NAMEMONTARIUS, KITAGAWA NO.:  [id number]   MEDICAL RECORD NO.:  [id number]          PATIENT TYPE:  INP   LOCATION:  2918                         FACILITY:  [facility]   PHYSICIAN:  Verne Carrow, MDDATE OF BIRTH:  [birth year]   DATE OF PROCEDURE:  03/18/2008  DATE OF DISCHARGE:                            CARDIAC CATHETERIZATION   A PERCUTANEOUS CORONARY INTERVENTION REPORT   PROCEDURE:  Percutaneous coronary intervention of the proximal right  coronary artery with placement of a drug-eluting stent.   OPERATOR:  Verne Carrow, MD   INDICATION:  Chest pain, abnormal diagnostic cath this morning.   DETAILS OF PROCEDURE:  The patient was brought to the heart  catheterization laboratory on the second floor after signing informed  consent.  Earlier today, he had presented to the outpatient cath lab  with complaints of chest pain and was found to have a 95% stenosis of  the proximal right coronary artery.  When he was brought upstairs to the  second floor lab, we exchanged the 4-French sheath for a 6-French  sheath.  A JR4 6-French guide was used to engage the right coronary  artery.  A Cougar intracoronary  wire was passed down the right coronary  artery.  Intracoronary nitroglycerin was given.  A 2.5 x 8-mm balloon  was used for predilation of the lesion.  A 3.0 x 12-mm PLATINUM drug-  eluting stent was implanted.  A 3.5 x 8-mm noncompliant balloon was used  for post dilatation.  There was 0% stenosis at the end of the procedure.  There was TIMI 3 flow down the vessel following the procedure.  The  patient did complain of some chest heaviness during the procedure, but  had no chest pain at the end of the case.  Angiomax was used for  anticoagulation.  The patient was given 600 mg of Plavix prior to  arriving to the cath lab on the second floor.  He was taken to the  recovery area in stable condition. His sheath will be removed from the  right femoral artery  in 2 hours.   IMPRESSION:  Successful percutaneous coronary intervention of the  proximal right coronary artery with placement of a drug-eluting stent.   RECOMMENDATIONS:  The patient should be continued on medical management  to include an aspirin, statin, beta-blocker, and also Plavix which  should be taken for at least 1 year.      Verne Carrow, MD  Electronically Signed     CM/MEDQ  D:  03/18/2008  T:  03/19/2008  Job:  [id number]   cc:   Learta Codding, MD,FACC

## 2010-12-20 NOTE — Assessment & Plan Note (Signed)
[name]                          [facility]ICE NOTE   [name], [name]                       MRN:          [id number]  DATE:07/22/2008                            DOB:          [birth year]    PRIMARY CARDIOLOGIST:  Luis Codding, MD, [facility]   REASON FOR VISIT:  Post-hospital followup.   [name] presents to our clinic today, following a recent visit to the  [facility] ED with complaint of palpitations.  He is referred to  [name] in consultation, who noted documented PVCs in the setting of  mild hypokalemia (3.3).  There was no evidence of active ischemia.  No  dysrhythmia was noted.  Magnesium and TSH levels were normal.   [name] recommended potassium supplementation and prescription for  p.r.n. Lopressor 12.5, for recurrent palpitations.  No further workup  was indicated.   Clinically, [name] continues to have occasional palpitations and he  can feel it in his ears.  This is a strong pounding sensation, which is  different from a quivering sensation that he also feels, as well.  The  latter occurs perhaps 2-3 times a week.  These quivering sensations,  however, are essentially asymptomatic.   We reviewed 2 prior CardioNet monitor results from earlier this year, in  July and September, with the latter one clearly showing documented PVCs.  The first monitor, however, did raise the question of possible atrial  dysrhythmia (AFib versus flutter) with 2-2.5 second pauses.  I had  reviewed this data at that time and assessed his CHAD2 score to be 1  (hypertension).  It is also noted that these rhythms had a controlled  ventricular rate.   [name] has not developed any exertional chest discomfort in the  interim.   CURRENT MEDICATIONS:  1. Labetalol 200 t.i.d.  2. Hydralazine 50 q.i.d.  3. Isosorbide mononitrate 15 daily.  4. Benazepril 40 daily.  5. Triamterene/hydrochlorothiazide 37.5/25 daily.  6. Crestor 5  daily.  7. Aspirin 325 daily.  8. Plavix.  9. Hytrin 5 daily.  10.Omeprazole.  11.Diclofenac.  12.Alprazolam.  13.Potassium 10 daily.   PHYSICAL EXAMINATION:  VITAL SIGNS:  Blood pressure 179/68; pulse 48,  regular; and weight 190.  GENERAL:  A 70 year old male, moderately obese, sitting upright, no  distress.  HEENT:  Normocephalic, atraumatic.  NECK:  Palpable carotid pulses without bruits; no JVD.  LUNGS:  Clear to auscultation in all fields.  HEART:  Regular rate and rhythm.  No significant murmurs.  ABDOMEN:  Protuberant, nontender, intact bowel sounds.  EXTREMITIES:  No edema.  NEUROLOGIC:  Flat affect, but no focal deficit.   IMPRESSION:  1. Recurrent palpitations.      a.     Documented PVCs.  2. Normal LVF.  3. Severe single-vessel CAD.      a.     Status post DES of 100% proximal RCA, August 2009.      b.     Residual nonobstructive LAD and CFX disease.  4. Systolic hypertension, uncontrolled.  5. Dyslipidemia.  6. Anxiety/depression.  7. Recent, mild hypokalemia.  1. Rhythm strips from the previous CardioNet monitor this past July      were reviewed with [name] who, in turn, faxed a copy to Dr.      Johney [name] in [place], who reviewed the strip as well.  It is still      not clear if this represents true atrial flutter/fibrillation,      versus 60-second cycle interference.  Nevertheless, it is clear      that this is not at a fast rate.  The CHAD2 score had been assessed      as 1, as I noted earlier.  He is on full-dose aspirin, in      conjunction with Plavix.  Therefore, no further workup of this is      recommended at this point in time.  Moreover, his symptoms are      correlated with the documented PVCs, not with any documented atrial      dysrhythmia.  2. [name] does, however, present with uncontrolled systolic      hypertension.  He is on numerous medications.  Given his relatively      low heart rate, however, this precludes the addition of  a      maintenance dose of beta-blocker.  However, I recommend favoring      Coreg at this point in time, given its strong vasodilatory affect,      in place of labetalol.  I will, therefore, allow him to complete      his current supply of labetalol and then transition him to Coreg      6.25 b.i.d.  He will need a followup blood pressure check 1 week      later.  3. Surveillance BMET for reassessment of mild hypokalemia.  The      patient is currently on low-dose supplemental potassium.  We do not      need to repeat a magnesium level, given that this was recently      normal.  4. Discontinue Imdur.  [name] is only on 15 mg a day, has no chest      pain, and was recently treated with a stent and had residual      nonobstructive CAD.  With respect to his Plavix, he will need to      remain on this for at least 1 year, as previously recommended      during      his intervention.  5. Schedule return clinic followup with myself and [name] in 4      months.      Luis Serpe, PA-C  Electronically Signed      Luis Codding, MD,FACC  Electronically Signed   GS/MedQ  DD: 07/22/2008  DT: 07/23/2008  Job #: [id number]   cc:   [name]

## 2010-12-20 NOTE — Assessment & Plan Note (Signed)
[facility]                                 ON-CALL NOTE   NAME:HODGESRicardo, Schubach                       MRN:          [id number]  DATE:02/24/2008                            DOB:          12/08/1940    I received a page through the answering service from [place] with [employer]  concerning Tami Lin.  Huntley Dec stated Mr. Dimartino was wearing a heart  monitor and had exactly 62 seconds of atrial fibrillation tonight.  I  called Mr. Digioia at home at [phone].  He states Dr. Andee Lineman had  evaluated him in June for palpitations and the patient has been wearing  a 3-week heart monitor.  Mr. Milhouse states he does not have any chest  discomfort or palpitations at this time.  I instructed him to begin a  low dose baby aspirin therapy and he states the monitor is supposed to  come off within the next couple of days and he has a followup  appointment with Dr. Andee Lineman scheduled.  If he has any further episodes  of atrial fibrillation with rapid ventricular response, he will need to  come to the emergency room.  He verbalized understanding      Dorian Pod, ACNP  Electronically Signed      Madolyn Frieze. Jens Som, MD, [facility]  Electronically Signed   MB/MedQ  DD: 02/24/2008  DT: 02/25/2008  Job #: [id number]

## 2010-12-20 NOTE — Assessment & Plan Note (Signed)
[facility]                          [facility] OFF[facility] NOTE   NAME:[name], [name]                       MRN:          [id number]  DATE:01/18/2009                            DOB:          February 21, 1941    HISTORY OF PRESENT ILLNESS:  The patient is a 70 year old male with a  history of severe single-vessel coronary artery status post drug-eluting  stent to the right coronary artery.  He also has minor nonobstructive  coronary artery disease of the circumflex and LAD.  He has normal LV  function.  The patient, as a matter of fact, reports in the last 10  months, no recurrence of angina.  He has no exertional dyspnea.  Prior  to increasing his labetalol, he had PVCs which were symptomatic, but  they have been controlled now.  The patient also had difficulty with  depression, but he has restarted desipramine which seems to be working  very well for him.   MEDICATIONS:  1. Terazosin 5 mg p.o. daily.  2. Alprazolam 1 mg 1-2 tablets p.o. nightly.  3. Diclofenac 50 mg 2 tablets p.o. b.i.d.  4. Centrum Silver 1 tablet p.o. daily.  5. Folic acid 1 tablet p.o. daily.  6. Omeprazole 20 mg half tablet p.o. daily.  7. Desipramine 50 mg p.o. daily.  8. Hytrin 5 mg p.o. nightly.  9. Plavix 75 mg p.o. daily.  10.Magnesium.  11.Zinc.  12.Aspirin 325 daily.  13.Triamterene and hydrochlorothiazide 37.5/25 mg daily.  14.Vitamin D 50,000 units twice weekly.  15.Labetalol 300 mg p.o. t.i.d.  16.Hydralazine 60 mg p.o. q.i.d.  17.Isosorbide mononitrate 30 mg half tablet p.o. daily.  18.Benazepril 40 mg p.o. daily.   PHYSICAL EXAMINATION:  VITAL SIGNS:  Blood pressure is 170/64, but the  patient assures me that at home his blood pressure has been running fine  and sometimes it goes low into the low 100s; heart rate 56; weight 198  pounds.  NECK:  Normal carotid upstroke and no carotid bruits.  No JVD.  HEENT:  Pupils are isocoric.  LUNGS:  Clear breath sounds  bilaterally.  HEART:  Regular rate and rhythm.  Normal S1 and S2.  No murmur, rubs, or  gallops.  ABDOMEN:  Soft, nontender. No rebound or guarding.  Good bowel sounds.  EXTREMITIES:  No cyanosis, clubbing, or edema.  NEUROLOGIC:  The patient is alert, oriented, and grossly nonfocal.   PROBLEM LIST:  1. Palpitations resolved when increased of labetalol.  2. Normal left ventricular function.  3. Single-vessel coronary artery disease status post drug-eluting      stenting to the right coronary artery, approximately 10 months ago.  4. Systolic hypertension.  According to the patient, controlled.  5. Dyslipidemia.  6. Anxiety and depression improved.  7. Hyperkalemia resolved.  8. 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   PLAN:  1. The patient presents today merely for preoperative clearance.  His      EKG was reviewed and demonstrated normal sinus rhythm.  No acute      ischemic changes.  There are no PVCs.  Also, symptomatically, the      patient has improved significantly from this perspective.  2. Anxiety and depression is largely controlled.  3. From a cardiac standpoint, I told the patient that at 10 months, we      certainly could consider a hip surgery.  As far Plavix, a week      before the surgery and then to resume at the safe time thereafter.      In the interim, we will proceed with a Lexiscan to make sure the      patient has no high-risk anatomy.  If the scan is normal, again the      patient can electively in the next couple of weeks to months      proceed with hip surgery.     Learta Codding, MD,FACC  Electronically Signed    GED/MedQ  DD: 01/18/2009  DT: 01/19/2009  Job #: [id number]   cc:   Sela Hilding, M.D.

## 2010-12-23 NOTE — Assessment & Plan Note (Signed)
[facility]                          [facility] NOTE   NAME:[name], [name]                       MRN:          [id number]  DATE:05/27/2008                            DOB:          11-07-1940    REFERRING PHYSICIAN:  Samuel Jester   HISTORY OF PRESENT ILLNESS:  The patient is a 70 year old male patient  with history of hypertension and significant arthritis.  The patient  continued to have chest pressure in light of recent Cardiolite study  that was low risk.  He has a prior history of transient atrial flutter,  sinus node dysfunction, and bradycardia in 2.5-second pauses.  However,  in followup Cardiolite monitoring, no pauses were seen and no flutter  was seen.  He also has a coronary artery disease and status post  platinum drug-eluting stent.  He was most recently hospitalized to  [facility] when he complained of palpitations.  He also had chest  pain, which was atypical.  The patient was ruled out for myocardial  infarction.  He now follows up in the office because he reported drop in  blood pressure cardiac rehab and diarrhea on a regular basis.  He  apparently lost 14 pounds.  I asked the patient to come into the office  today to check his orthostatics.  Indeed, he is orthostatic with a blood  pressure dropped from 124 systolic to 108.  I suspect his diarrhea  secondary to citalopram and Dr. Charm Barges was already made a change to  Cymbalta.  The patient stated that his diarrhea actually has resolved.  He has no chest pain, no palpitations, no shortness of breath and feels  improved since I last got his message about his problems at the cardiac  rehab.  He reports that there was an element of dehydration secondary to  his diarrhea.   MEDICATIONS:  1. Hytrin 5 mg nightly.  2. Plavix 75 mg q.a.m.  3. Magnesium and zinc.  4. Aspirin 325 nightly.  5. Crestor 5 mg p.r.n.  6. Isosorbide mononitrate 30 mg half tab p.o. daily.  7.  Labetalol 200 mg 2 tablets p.o. b.i.d.  8. Hydralazine 50 mg one and half tablets t.i.d.  9. Triamterene hydrochlorothiazide 37.5/25 mg p.o. daily.  10.Cymbalta 30 mg p.o. nightly.  11.Clonazepam 0.5 mg half tablet p.o. b.i.d.  12.Vitamin D and super multiple B daily.   PHYSICAL EXAMINATION:  VITAL SIGNS:  As listed above with a line blood  pressure of 124/56 and a heart rate of 49, standing blood pressure  108/56 with heart rate of 49.  GENERAL:  Well-nourished white male in no apparent distress.  HEENT:  Pupils anisocoric.  Conjunctiva clear.  NECK:  Supple.  Normal carotid upstroke.  No carotid bruits.  LUNGS:  Clear breath sounds bilaterally.  HEART:  Regular rate and rhythm.  Normal sinus S1 and S2.  No murmur,  rubs, or gallops.  ABDOMEN:  Soft and nontender.  No rebound or guarding.  Good bowel  sounds.  EXTREMITY:  No cyanosis, clubbing, or edema.   LABORATORY WORK:  Glucose 109, BUN 19, creatinine  1.4, GFR 50, and  calcium 8.6.  Sodium 130, potassium 4.1, chloride 95, CO2 29, anion gap  10.1.  Hemoglobin 11.9, white count 7.9, hematocrit 34.7, and platelet  count 306.   PROBLEM LIST:  1. Severe single-vessel coronary artery disease, status post platinum      drug-eluting stent and total proximal right coronary artery, August      2009.  2. Residual nonobstructive LAD as circumflex disease.  3. Normal LV function.  4. Status post right femoral pseudoaneurysm successfully treated with      thrombin injection.  5. History of transient atrial flutter and normal sinus rhythm, PVCs      and PACs on second cardiac monitoring.  6. Hypertension, now with orthostatic hypotension.  7. Dyslipidemia.  8. Depression.   PLAN:  1. We will decrease the patient's labetalol to 200 mg twice a day.  2. I also have to change his hydralazine to 50 mg four times a day to      smoothen out his blood pressure.  3. I have asked the patient to call us in 5-7 days with an update on      his  condition.  4. His diarrhea seems to be resolved by making a switch from Celexa to      Cymbalta.     Learta Codding, MD,FACC  Electronically Signed    GED/MedQ  DD: 05/31/2008  DT: 05/31/2008  Job #: [id number]   cc:   Samuel Jester

## 2011-01-04 ENCOUNTER — Telehealth: Payer: Self-pay | Admitting: *Deleted

## 2011-01-04 NOTE — Telephone Encounter (Signed)
Patient called wanting to give [place] an updated medication list.  Patient will have daughter fax list to [place] tomorrow.

## 2011-01-06 ENCOUNTER — Ambulatory Visit: Payer: Self-pay | Admitting: Cardiology

## 2011-01-10 NOTE — Telephone Encounter (Signed)
Spoke with pt.   Daughter has not faxed yet.  Will send as soon as she can.

## 2011-01-11 NOTE — Telephone Encounter (Signed)
Have not received any info to date.

## 2011-01-13 NOTE — Telephone Encounter (Addendum)
Received medical release from daughter for [place] to request notes from [facility].  Will fax.

## 2011-01-19 ENCOUNTER — Ambulatory Visit (INDEPENDENT_AMBULATORY_CARE_PROVIDER_SITE_OTHER): Payer: Medicare Other | Admitting: Cardiovascular Disease

## 2011-01-19 ENCOUNTER — Encounter: Payer: Self-pay | Admitting: Cardiovascular Disease

## 2011-01-19 ENCOUNTER — Ambulatory Visit: Payer: Self-pay | Admitting: Cardiovascular Disease

## 2011-01-19 VITALS — BP 138/50 | HR 37 | Ht 64.0 in | Wt 184.0 lb

## 2011-01-19 DIAGNOSIS — I4891 Unspecified atrial fibrillation: Secondary | ICD-10-CM

## 2011-01-19 DIAGNOSIS — I259 Chronic ischemic heart disease, unspecified: Secondary | ICD-10-CM

## 2011-01-19 DIAGNOSIS — I10 Essential (primary) hypertension: Secondary | ICD-10-CM

## 2011-01-19 DIAGNOSIS — I498 Other specified cardiac arrhythmias: Secondary | ICD-10-CM

## 2011-01-19 DIAGNOSIS — R001 Bradycardia, unspecified: Secondary | ICD-10-CM | POA: Insufficient documentation

## 2011-01-19 MED ORDER — LABETALOL HCL 300 MG PO TABS: 150.0000 mg | ORAL_TABLET | Freq: Three times a day (TID) | ORAL | Status: DC

## 2011-01-19 MED ORDER — CLONIDINE HCL 0.1 MG/24HR TD PTWK: 1.0000 | MEDICATED_PATCH | TRANSDERMAL | Status: DC

## 2011-01-19 NOTE — Assessment & Plan Note (Signed)
The patient seems to be doing reasonably well. Many of his symptoms might be related to bradycardia. We'll reevaluate his symptoms after his heart rate improves.

## 2011-01-19 NOTE — Progress Notes (Signed)
HPI  This is a 70 year old man is here today for a followup visit. The patient has known history of coronary artery disease status post angioplasty and drug-eluting stent placement to the right coronary artery in August 2009. He had multiple and extensive complications recently related to hip replacement. Patient had revision of hip replacement in March of 2011. He was in rehabilitation for a long time but ultimately he had a dysfunctional prosthesis which led to pelvis fracture. He had prolonged hospitalization in September of 2011 until November do to that as well as recurrent infections. He also had severe anemia and required multiple units of blood transfusion. He received a prolonged course of IV antibiotics with vancomycin. 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 He was admitted in April with stroke and was told that there was a stenosis in the back of his neck which was not amenable for revascularization. Recently, the patient has been having problems with bradycardia was feeling dizzy and tired. He checks his blood pressure and heart rate frequently. He has been on a clonidine patch 0.3 mg which is the newest blood pressure medication added. He is on labetalol 300 mg tablet and was taking it 3 times daily but it was decreased by Dr. Maryellen Pile to a half tablet 3 times daily 2 weeks ago I suspect due to the same reason of bradycardia. He does have associated chest pain with this as well as dyspnea. He is still anemic with a hemoglobin of 9.5. Creatinine most recently was 1.82.  Allergies  Allergen Reactions  . Citalopram     REACTION: diarrhea  . Codeine   . Hydralazine     Drug induced lupus     Current Outpatient Prescriptions on File Prior to Visit  Medication Sig Dispense Refill  . ALPRAZolam (XANAX) 1 MG tablet Take 1 mg by mouth at bedtime as needed.          . Calcium Carb-Cholecalciferol (CALCIUM 500 +D) 500-400 MG-UNIT TABS Take 1 tablet by mouth daily.        Marland Kitchen desipramine (NOPRAMIN) 100 MG tablet Take 100 mg by mouth at bedtime.        . furosemide (LASIX) 40 MG tablet Take 40 mg by mouth daily.        . Multiple Vitamins-Minerals (CENTRUM SILVER PO) Take 1 tablet by mouth daily.        . nitroGLYCERIN (NITROSTAT) 0.4 MG SL tablet Place 0.4 mg under the tongue every 5 (five) minutes as needed.        Marland Kitchen omeprazole (PRILOSEC) 20 MG capsule Take 10 mg by mouth daily.       . QUEtiapine (SEROQUEL) 50 MG tablet Take 50 mg by mouth at bedtime.        Marland Kitchen terazosin (HYTRIN) 5 MG capsule Take 5 mg by mouth at bedtime.        Marland Kitchen DISCONTD: labetalol (NORMODYNE) 300 MG tablet Take 300 mg by mouth 3 (three) times daily.       Marland Kitchen DISCONTD: aspirin 325 MG EC tablet Take 325 mg by mouth daily.        Marland Kitchen DISCONTD: benazepril (LOTENSIN) 40 MG tablet Take 40 mg by mouth daily.        Marland Kitchen DISCONTD: Cholecalciferol (VITAMIN D3) 400 UNITS CAPS Take 1 capsule by mouth daily.        Marland Kitchen DISCONTD: cloNIDine (CATAPRES)  0.1 MG tablet Take 0.1 mg by mouth 2 (two) times daily.        Marland Kitchen DISCONTD: DPH-Lido-AlHydr-MgHydr-Simeth (FIRST-MOUTHWASH BLM) SUSP Use as directed in the mouth or throat.        Marland Kitchen DISCONTD: folic acid (FOLVITE) 1 MG tablet Take 1 mg by mouth daily.        Marland Kitchen DISCONTD: hydrALAZINE (APRESOLINE) 25 MG tablet Take 25 mg by mouth 3 (three) times daily.        Marland Kitchen DISCONTD: iron polysaccharides (NIFEREX) 150 MG capsule Take 150 mg by mouth 2 (two) times daily.        Marland Kitchen DISCONTD: isosorbide dinitrate (ISORDIL) 30 MG tablet Take 30 mg by mouth daily.        Marland Kitchen DISCONTD: minoxidil (LONITEN) 2.5 MG tablet Take 2.5 mg by mouth 2 (two) times daily.        Marland Kitchen DISCONTD: niacin 500 MG tablet Take 500 mg by mouth 2 (two) times daily.        Marland Kitchen DISCONTD: Probiotic Product (RISAQUAD PO) Take 1 tablet by mouth daily.        Marland Kitchen DISCONTD: Red Yeast Rice 600 MG CAPS Take 1 capsule by mouth  2 (two) times daily.        Marland Kitchen DISCONTD: spironolactone (ALDACTONE) 25 MG tablet Take 25 mg by mouth daily.        Marland Kitchen DISCONTD: zinc gluconate 50 MG tablet Take 50 mg by mouth daily.           Past Medical History  Diagnosis Date  . Normal echocardiogram     normal left ventricular function  . Coronary artery disease     severe single vessel coronary artery disease status post drug elutingstenting of total proximal right coronary artery  august 2009  . Hypertension   . Chronic kidney disease   . Anemia   . Atrial flutter     transient  . Anxiety   . Arthritis      Past Surgical History  Procedure Date  . Right femoral pseudoaneur     successfully trated with thrombin injection  . Replacement total knee bilateral   . Lumbar disc surgery   . Cataract surgery   . Total hip arthroplasty   . Coronary angioplasty      History reviewed. No pertinent family history.   History   Social History  . Marital Status: Single    Spouse Name: N/A    Number of Children: N/A  . Years of Education: N/A   Occupational History  . Not on file.   Social History Main Topics  . Smoking status: Never Smoker   . Smokeless tobacco: Never Used  . Alcohol Use: Yes  . Drug Use: No  . Sexually Active: Not on file   Other Topics Concern  . Not on file   Social History Narrative  . No narrative on file       PHYSICAL EXAM   BP 138/50  Pulse 37  Ht 5\' 4"  (1.626 m)  Wt 184 lb (83.462 kg)  BMI 31.58 kg/m2  SpO2 92% Constitutional: She is oriented to person, place, and time. She appears well-developed and well-nourished. No distress.  HENT: No nasal discharge.  Head: Normocephalic and atraumatic.  Eyes: Pupils are equal, round, and reactive to light. Right eye exhibits no discharge. Left eye exhibits no discharge.  Neck: Normal range of motion. Neck supple. No JVD present. No thyromegaly present.  Cardiovascular: Irregular rhythm and bradycardic. Heart sounds  are distant.. Exam  reveals no gallop and no friction rub.  No murmur heard.  Pulmonary/Chest: Effort normal and breath sounds normal. No stridor. No respiratory distress. She has no wheezes. She has no rales. She exhibits no tenderness.  Abdominal: Soft. Bowel sounds are normal. She exhibits no distension. There is no tenderness. There is no rebound and no guarding.  Musculoskeletal: Normal range of motion. She exhibits +1 edema and no tenderness.  Neurological: She is alert and oriented to person, place, and time. Coordination normal.  Skin: Skin is warm and dry. No rash noted. She is not diaphoretic. No erythema. No pallor.  Psychiatric: She has a normal mood and affect. Her behavior is normal. Judgment and thought content normal.     EKG: Atrial fibrillation with slow ventricular response and a heart rate of 41 beats per minute. Nonspecific T-wave changes. Borderline prolonged QT.   ASSESSMENT AND PLAN

## 2011-01-19 NOTE — Assessment & Plan Note (Signed)
The patient is significantly bradycardic and seems to be symptomatic from this. I reviewed his recent labs which showed a white cell count of 4.7, hemoglobin 9.5, BUN of 29 and a creatinine of 1.82. His electrolytes were unremarkable. I would like to check his thyroid function and thus we'll order a TSH. I think the first step is to rule out medication-induced bradycardia. I would decrease the dose of clonidine patch to 0.1 mg once weekly which might be causing bradycardia through a central effect. I would also decrease labetalol to 150 mg once daily only. We'll have him followup in one month from now. Would consider obtaining an echocardiogram but he did have one at the end of last year at [facility]. Ejection fraction was normal.

## 2011-01-19 NOTE — Patient Instructions (Addendum)
Follow up as scheduled. Decrease Clonidine patch to 0.1mg  per week. You will need to get new prescription filled. Decrease Labetalol to 1/2 tablet daily. Your physician recommends that you go to the [facility] for lab work: TSH

## 2011-01-19 NOTE — Assessment & Plan Note (Signed)
His blood pressure is difficult to control. Would consider increasing amlodipine if needed but according to the patient that caused lower extremity edema in the past.

## 2011-01-19 NOTE — Assessment & Plan Note (Signed)
The patient seems to have newly diagnosed atrial fibrillation. He did have transient atrial flutter in the past. His ventricular rate is very slow. Management if bradycardias as outlined above. If he develops tachybradycardia syndrome, it might be necessary to place a pacemaker although this would be a difficult decision due to his recurrent infections and bacteremia related to his hip surgery. Regarding long-term anticoagulation, his CHADS score is 1 due to history of hypertension. Continue with aspirin daily at this time. He is any way of a marginal candidate for anticoagulation due to his current anemia.

## 2011-03-03 ENCOUNTER — Encounter: Payer: Self-pay | Admitting: Cardiovascular Disease

## 2011-03-03 ENCOUNTER — Ambulatory Visit (INDEPENDENT_AMBULATORY_CARE_PROVIDER_SITE_OTHER): Payer: Medicare Other | Admitting: Cardiovascular Disease

## 2011-03-03 VITALS — BP 145/56 | HR 36 | Ht 65.5 in | Wt 188.0 lb

## 2011-03-03 DIAGNOSIS — I498 Other specified cardiac arrhythmias: Secondary | ICD-10-CM

## 2011-03-03 DIAGNOSIS — Z79899 Other long term (current) drug therapy: Secondary | ICD-10-CM

## 2011-03-03 DIAGNOSIS — I259 Chronic ischemic heart disease, unspecified: Secondary | ICD-10-CM

## 2011-03-03 DIAGNOSIS — R001 Bradycardia, unspecified: Secondary | ICD-10-CM

## 2011-03-03 DIAGNOSIS — I10 Essential (primary) hypertension: Secondary | ICD-10-CM

## 2011-03-03 MED ORDER — POTASSIUM CHLORIDE 10 MEQ PO TBCR: 10.0000 meq | EXTENDED_RELEASE_TABLET | Freq: Every day | ORAL | Status: DC

## 2011-03-03 NOTE — Assessment & Plan Note (Signed)
The patient has known history of refractory hypertension. His blood pressure control might worsen after stopping clonidine and labetalol. He does have oral clonidine 0.1 mg which can be used as needed for systolic blood pressure above 161. If additional blood pressure control is needed, I would consider increasing the dose of Hytrin to twice a day. Also if his renal function comes back close to normal, then we might be able to add high dose Aldactone. Minoxidil can be considered as a last resort. He is already on a maximal dose of lisinopril and amlodipine.

## 2011-03-03 NOTE — Assessment & Plan Note (Signed)
The patient is still bradycardic in spite of decreasing the dose of clonidine and labetalol. I will go ahead and stop both of these medications. I will request labs that include TSH and CMP to followup on his renal function. I asked him to monitor his blood pressure and heart rate over the next week and call us with the readings. If he continues to be bradycardic without any identifiable cause, I will refer him to electrophysiology for consideration for pacemaker placement. The patient's symptoms include dizziness and dyspnea without syncope.

## 2011-03-03 NOTE — Patient Instructions (Signed)
   Stop Clonidine  Stop Labetalol Your physician recommends that you go to the [facility] (or your primary MD) for lab work: CMP/TSH Call in 1 week with your blood pressures and heart rates. We will make a decision regarding follow up after reviewed these findings.

## 2011-03-03 NOTE — Progress Notes (Signed)
HPI  This is a 70 year old male who is here today for a followup visit. he has a history of coronary artery disease status post angioplasty and drug-eluting stent placement to the right coronary artery in 2009. He was seen recently for her bradycardia. At that time he his heart rate was 37 beats per minute. His symptoms included dizziness without syncope or presyncope. His functional capacity is reduced do to complications from left hip surgery. He uses a wheelchair. However, he is able to walk with a cane. At that time I decreased the dose of clonidine patch to 0.1 mg and also cut down the dose of labetalol to 150 mg once daily. I requested a thyroid panel at that has not been done. 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 He has chronic kidney disease that limits him off the use of antihypertensive medications. Overall, he feels reasonably well. His bradycardia is not persistent. He checks his blood pressure and heart rate frequently. This is the lowest heart rate for him. He complains of dizziness when he is walking but as mentioned above there has been no syncope. He does have increased dyspnea but denies any chest pain.    Allergies  Allergen Reactions  . Citalopram     REACTION: diarrhea  . Codeine   . Hydralazine     Drug induced lupus     Current Outpatient Prescriptions on File Prior to Visit  Medication Sig Dispense Refill  . acetaminophen (TYLENOL) 325 MG tablet Take 650 mg by mouth every 6 (six) hours as needed.        . ALPRAZolam (XANAX) 1 MG tablet Take 1 mg by mouth at bedtime as needed.        Marland Kitchen amLODipine (NORVASC) 5 MG tablet Take 5 mg by mouth 2 (two) times daily.        . Calcium Carb-Cholecalciferol (CALCIUM 500 +D) 500-400 MG-UNIT TABS Take 1 tablet by mouth daily.        Marland Kitchen desipramine (NOPRAMIN) 100 MG tablet Take 100 mg by mouth at bedtime.        . docusate sodium (COLACE) 100 MG capsule Take 100 mg by mouth daily as  needed.        . hydroxychloroquine (PLAQUENIL) 200 MG tablet Take 200 mg by mouth 2 (two) times daily.        . isosorbide mononitrate (IMDUR) 120 MG 24 hr tablet Take 120 mg by mouth daily.        Marland Kitchen lisinopril (PRINIVIL,ZESTRIL) 20 MG tablet Take 20 mg by mouth 2 (two) times daily.        . Magnesium Chloride (MAGNESIUM DR PO) Take 1 tablet by mouth daily.        . Multiple Vitamins-Minerals (CENTRUM SILVER PO) Take 1 tablet by mouth daily.        . nitroGLYCERIN (NITROSTAT) 0.4 MG SL tablet Place 0.4 mg under the tongue every 5 (five) minutes as needed.        Marland Kitchen omeprazole (PRILOSEC) 20 MG capsule Take 10 mg by mouth daily.       . polysaccharide iron complex (NIFEREX) 100 MG/5ML elixir Take 150 mg by mouth 2 (two) times daily.       . QUEtiapine (SEROQUEL) 50 MG tablet Take 50 mg by mouth at bedtime.        Marland Kitchen terazosin (HYTRIN) 5 MG capsule Take 5 mg by mouth at bedtime.  Past Medical History  Diagnosis Date  . Normal echocardiogram     normal left ventricular function  . Coronary artery disease     severe single vessel coronary artery disease status post drug elutingstenting of total proximal right coronary artery  august 2009  . Hypertension   . Chronic kidney disease   . Anemia   . Atrial flutter     transient  . Anxiety   . Arthritis      Past Surgical History  Procedure Date  . Right femoral pseudoaneur     successfully trated with thrombin injection  . Replacement total knee bilateral   . Lumbar disc surgery   . Cataract surgery   . Total hip arthroplasty   . Coronary angioplasty      No family history on file.   History   Social History  . Marital Status: Single    Spouse Name: N/A    Number of Children: N/A  . Years of Education: N/A   Occupational History  . Not on file.   Social History Main Topics  . Smoking status: Never Smoker   . Smokeless tobacco: Never Used  . Alcohol Use: Yes  . Drug Use: No  . Sexually Active: Not on file    Other Topics Concern  . Not on file   Social History Narrative  . No narrative on file       PHYSICAL EXAM   BP 145/56  Pulse 36  Ht 5' 5.5" (1.664 m)  Wt 188 lb (85.276 kg)  BMI 30.81 kg/m2  Constitutional: He is oriented to person, place, and time. He appears well-developed and well-nourished. No distress.  HENT: No nasal discharge.  Head: Normocephalic and atraumatic.  Eyes: Pupils are equal, round, and reactive to light. Right eye exhibits no discharge. Left eye exhibits no discharge.  Neck: Normal range of motion. Neck supple. No JVD present. No thyromegaly present.  Cardiovascular: Very bradycardic but  regular rhythm, normal heart sounds and intact distal pulses. Exam reveals no gallop and no friction rub.  No murmur heard.  Pulmonary/Chest: Effort normal and breath sounds normal. No stridor. No respiratory distress. He has no wheezes. He has no rales. He exhibits no tenderness.  Abdominal: Soft. Bowel sounds are normal. He exhibits no distension. There is no tenderness. There is no rebound and no guarding.  Musculoskeletal: Normal range of motion. He exhibits no edema and no tenderness.  Neurological: He is alert and oriented to person, place, and time. Coordination normal.  Skin: Skin is warm and dry. No rash noted. He is not diaphoretic. No erythema. No pallor.  Psychiatric: He has a normal mood and affect. His behavior is normal. Judgment and thought content normal.       EKG: Sinus bradycardia with a heart rate for 37 beats per minute. No evidence of AV block. No significant ST or T wave changes. Normal QT interval    ASSESSMENT AND PLAN

## 2011-03-03 NOTE — Assessment & Plan Note (Signed)
The patient is not having clear symptoms of angina. Continue aspirin daily. The patient will likely need to resume taking a statin in the future. I'm not sure why it was stopped and this will have to be clarified with the patient.

## 2011-03-09 ENCOUNTER — Telehealth: Payer: Self-pay | Admitting: *Deleted

## 2011-03-09 MED ORDER — TERAZOSIN HCL 5 MG PO CAPS: 5.0000 mg | ORAL_CAPSULE | Freq: Two times a day (BID) | ORAL | Status: DC

## 2011-03-09 NOTE — Telephone Encounter (Signed)
Pt's dgt faxed BP/HR's per Dr. Jari Sportsman request at recent office visit.   Per Dr. Kirke Corin:  His labs were fine.  Stay off Clonidine/labetalol.  Change Hytrin to 5mg  two times a day to improve BP  Follow up in 1 month.  Pt's dgt notified and verbalized understanding.

## 2011-03-10 ENCOUNTER — Encounter: Payer: Self-pay | Admitting: Cardiovascular Disease

## 2011-04-12 ENCOUNTER — Encounter: Payer: Self-pay | Admitting: Cardiology

## 2011-04-13 ENCOUNTER — Ambulatory Visit (INDEPENDENT_AMBULATORY_CARE_PROVIDER_SITE_OTHER): Payer: Medicare Other | Admitting: Physician Assistant

## 2011-04-13 ENCOUNTER — Encounter: Payer: Self-pay | Admitting: Physician Assistant

## 2011-04-13 VITALS — BP 124/69 | HR 64 | Ht 65.0 in | Wt 199.0 lb

## 2011-04-13 DIAGNOSIS — E785 Hyperlipidemia, unspecified: Secondary | ICD-10-CM

## 2011-04-13 DIAGNOSIS — I4891 Unspecified atrial fibrillation: Secondary | ICD-10-CM

## 2011-04-13 DIAGNOSIS — R001 Bradycardia, unspecified: Secondary | ICD-10-CM

## 2011-04-13 DIAGNOSIS — Z8679 Personal history of other diseases of the circulatory system: Secondary | ICD-10-CM

## 2011-04-13 DIAGNOSIS — I498 Other specified cardiac arrhythmias: Secondary | ICD-10-CM

## 2011-04-13 DIAGNOSIS — I259 Chronic ischemic heart disease, unspecified: Secondary | ICD-10-CM

## 2011-04-13 NOTE — Patient Instructions (Signed)
   21 day Cardionet monitor Follow up in  1 month - see above

## 2011-04-13 NOTE — Assessment & Plan Note (Addendum)
Will further evaluate with a 21 day CardioNet monitor, to rule out significant bradycardia. Patient currently in NSR with first-degree AVB, at approximately 70 bpm. Both clonidine patch and labetalol were discontinued, at time of last visit. We'll also check metabolic profile. TSH normal, back in June.

## 2011-04-13 NOTE — Assessment & Plan Note (Signed)
Currently well controlled.  

## 2011-04-13 NOTE — Assessment & Plan Note (Signed)
Will request most recent labs from primary MDs office. Aggressive management recommended with target LDL 70 or less, if feasible.

## 2011-04-13 NOTE — Assessment & Plan Note (Signed)
quiescent on current regimen. Negative Lexiscan Cardiolite, June 2010.

## 2011-04-13 NOTE — Progress Notes (Signed)
Patient seen and examined in conjunction with Dr. Andee Lineman.

## 2011-04-13 NOTE — Progress Notes (Signed)
Addended by: Murriel Hopper on: 04/13/2011 02:34 PM   Modules accepted: Orders

## 2011-04-13 NOTE — Assessment & Plan Note (Signed)
Patient reports frequent "PVCs", but denies symptoms suggestive of tachycardia. We'll further clarify with continuous monitoring.

## 2011-04-13 NOTE — Progress Notes (Addendum)
HPI: patient returns for scheduled visit.  When last seen, both clonidine patch and labetalol were DC'd, secondary to marked bradycardia (36). Patient reports general improvement; however, he still notes occasional rates in the 50s, and even in the high 30s. He denies any associated syncope, but does report weakness and lightheadedness. He states that these low heart rates can last for several hours in duration.  Patient denies any exertional CP or DOE. He had a negative Lexiscan, June 2010. He also reports palpitations, referred to as a "thumping". However, he denies tachycardia palpitations.  Allergies  Allergen Reactions  . Citalopram     REACTION: diarrhea  . Codeine   . Hydralazine     Drug induced lupus    Current Outpatient Prescriptions on File Prior to Visit  Medication Sig Dispense Refill  . ALPRAZolam (XANAX) 1 MG tablet Take 2 mg by mouth at bedtime as needed.       Marland Kitchen amLODipine (NORVASC) 5 MG tablet Take 5 mg by mouth 2 (two) times daily.        Marland Kitchen aspirin 325 MG tablet Take 325 mg by mouth daily.        . Calcium Carb-Cholecalciferol (CALCIUM 500 +D) 500-400 MG-UNIT TABS Take 1 tablet by mouth daily.        Marland Kitchen desipramine (NOPRAMIN) 100 MG tablet Take 100 mg by mouth at bedtime.        . docusate sodium (COLACE) 100 MG capsule Take 100 mg by mouth daily as needed.        . furosemide (LASIX) 80 MG tablet Take 80 mg by mouth 2 (two) times daily.       . hydroxychloroquine (PLAQUENIL) 200 MG tablet Take 200 mg by mouth 2 (two) times daily.        . isosorbide mononitrate (IMDUR) 120 MG 24 hr tablet Take 120 mg by mouth daily.        . Multiple Vitamins-Minerals (CENTRUM SILVER PO) Take 1 tablet by mouth daily.        . nitroGLYCERIN (NITROSTAT) 0.4 MG SL tablet Place 0.4 mg under the tongue every 5 (five) minutes as needed.        Marland Kitchen omeprazole (PRILOSEC) 20 MG capsule Take 10 mg by mouth daily.       . QUEtiapine (SEROQUEL) 50 MG tablet Take 50 mg by mouth at bedtime.           Past Medical History  Diagnosis Date  . Normal echocardiogram     normal left ventricular function  . Coronary artery disease     severe single vessel coronary artery disease status post drug elutingstenting of total proximal right coronary artery  august 2009  . Hypertension   . Chronic kidney disease   . Anemia   . Atrial flutter     transient  . Anxiety   . Arthritis     Past Surgical History  Procedure Date  . Right femoral pseudoaneur     successfully trated with thrombin injection  . Replacement total knee bilateral   . Lumbar disc surgery   . Cataract surgery   . Total hip arthroplasty   . Coronary angioplasty     History   Social History  . Marital Status: Single    Spouse Name: N/A    Number of Children: N/A  . Years of Education: N/A   Occupational History  . Not on file.   Social History Main Topics  . Smoking status: Never Smoker   .  Smokeless tobacco: Never Used  . Alcohol Use: Yes  . Drug Use: No  . Sexually Active: Not on file   Other Topics Concern  . Not on file   Social History Narrative  . No narrative on file    No family history on file.  ROS: Negative for exertional chest pain, DOE, orthopnea, PND, lower extremity edema, palpitations, syncope, claudication, reflux, hematuria, hematochezia, or melena. He continues to report memory loss. Remaining systems reviewed, and are negative.   PHYSICAL EXAM:  BP 124/69  Pulse 64  Ht 5\' 5"  (1.651 m)  Wt 199 lb (90.266 kg)  BMI 33.12 kg/m2  GENERAL: well-nourished, well-developed; NAD HEENT: NCAT, PERRLA, EOMI; sclera clear; no xanthelasma NECK: palpable bilateral carotid pulses, no bruits; no JVD; no TM LUNGS: CTA bilaterally CARDIAC: RRR (S1, S2); no significant murmurs; no rubs or gallops ABDOMEN: soft, non-tender; intact BS EXTREMETIES: intact distal pulses; no significant peripheral edema SKIN: warm/dry; no obvious rash/lesions MUSCULOSKELETAL: no joint deformity NEURO: no  focal deficit; NL affect   EKG: refer to EKG section    ASSESSMENT & PLAN:

## 2011-04-21 DIAGNOSIS — I4891 Unspecified atrial fibrillation: Secondary | ICD-10-CM

## 2011-04-26 ENCOUNTER — Encounter: Payer: Self-pay | Admitting: Physician Assistant

## 2011-04-26 ENCOUNTER — Telehealth: Payer: Self-pay | Admitting: *Deleted

## 2011-04-26 DIAGNOSIS — I455 Other specified heart block: Secondary | ICD-10-CM

## 2011-04-26 NOTE — Telephone Encounter (Signed)
Pt scheduled with Dr. Johney Frame for October 29th at 9:30 am.  Left message on pt's voicemail to return call to notify of monitor findings and need for EP referral.

## 2011-04-26 NOTE — Telephone Encounter (Signed)
Message copied by Arlyss Gandy on Wed Apr 26, 2011  4:42 PM ------      Message from: Arlyss Gandy      Created: Wed Apr 26, 2011  4:41 PM       Per Rozell Searing, PA-C's review, pt needs referral to EP for possible PPM.

## 2011-04-28 NOTE — Telephone Encounter (Signed)
Pt notified of monitor findings and Gene's recommendation for referral to Dr. Johney Frame. Pt is agreeable with this plan and is aware of appt information.

## 2011-05-01 ENCOUNTER — Telehealth: Payer: Self-pay | Admitting: Cardiology

## 2011-05-04 ENCOUNTER — Telehealth: Payer: Self-pay | Admitting: *Deleted

## 2011-05-04 ENCOUNTER — Encounter: Payer: Self-pay | Admitting: Physician Assistant

## 2011-05-04 NOTE — Telephone Encounter (Signed)
Called and spoke with [name] r/e patient needing an earlier appointment and was informed to send request to [facility] so that appointment can be arranged.

## 2011-05-04 NOTE — Telephone Encounter (Signed)
Patient can come in on 05/08/11 at 8:30am

## 2011-05-04 NOTE — Telephone Encounter (Signed)
Discussed results of monitor with pt (pauses of 3.5-4.0 sec). Pt has not passed out, but is still driving. Reviewed meds, and he is no longer on rate controlling meds. Discussed with GD - pt does not require immediate hospitalization, but does need to see Dr Johney Frame sooner than current 10/29. Will try to schedule appt for next week. Meanwhile, pt advised to stop driving.

## 2011-05-04 NOTE — Telephone Encounter (Signed)
Gene spoke with patient on 9/27.

## 2011-05-05 LAB — CBC
Hemoglobin: 11.6 — ABNORMAL LOW
RBC: 3.39 — ABNORMAL LOW
WBC: 6.6

## 2011-05-05 LAB — CARDIAC PANEL(CRET KIN+CKTOT+MB+TROPI)
Relative Index: 2.8 — ABNORMAL HIGH
Troponin I: 0.04

## 2011-05-05 LAB — BASIC METABOLIC PANEL
Calcium: 8.5
GFR calc Af Amer: >60
GFR calc non Af Amer: >60
Sodium: 138

## 2011-05-05 LAB — HDL CHOLESTEROL: HDL: 28 — ABNORMAL LOW

## 2011-05-05 LAB — HEMOGLOBIN A1C: Mean Plasma Glucose: 108

## 2011-05-05 NOTE — Telephone Encounter (Signed)
Pt notified and verbalized understanding. He is aware that appt with Dr. Johney Frame has been moved up to 10/1 at 8:30am.

## 2011-05-05 NOTE — Telephone Encounter (Signed)
Please notify patient of this appointment due to his cardionet report.

## 2011-05-08 ENCOUNTER — Ambulatory Visit (INDEPENDENT_AMBULATORY_CARE_PROVIDER_SITE_OTHER): Payer: Medicare Other | Admitting: Internal Medicine

## 2011-05-08 ENCOUNTER — Encounter: Payer: Self-pay | Admitting: Internal Medicine

## 2011-05-08 ENCOUNTER — Institutional Professional Consult (permissible substitution): Payer: Medicare Other | Admitting: Internal Medicine

## 2011-05-08 DIAGNOSIS — R001 Bradycardia, unspecified: Secondary | ICD-10-CM

## 2011-05-08 DIAGNOSIS — I10 Essential (primary) hypertension: Secondary | ICD-10-CM

## 2011-05-08 DIAGNOSIS — I2510 Atherosclerotic heart disease of native coronary artery without angina pectoris: Secondary | ICD-10-CM

## 2011-05-08 DIAGNOSIS — I498 Other specified cardiac arrhythmias: Secondary | ICD-10-CM

## 2011-05-08 NOTE — Patient Instructions (Addendum)
Your physician recommends that you schedule a follow-up appointment in: [place] in 4 weeks with Dr Johney Frame

## 2011-05-08 NOTE — Progress Notes (Signed)
[name] is a pleasant 70 y.o. WM patient with a h/o HTN, CAD, and bradycardia who presents today for EP consultation.  He reports having "slow heart beats" with HR upper 30s and lower 40s several months ago.  He reports associated fatigue. He did not have dizziness, presyncope, or syncope. He was taken off of chronotropic medications including labetolol.  Since that time, he reports doing well. His fatigue is much improved.   Today, he denies symptoms of palpitations, chest pain, shortness of breath, orthopnea, PND,  or neurologic sequela. He has stable BLE edema.   The patient is tolerating medications without difficulties and is otherwise without complaint today.  He has an event monitor on presently.  He has had several pauses, up to 4 seconds in duration, without any associated symptoms.  He is referred for EP consultation and consideration of pacemaker.   Past Medical History  Diagnosis Date  . Normal echocardiogram     normal left ventricular function  . Coronary artery disease     severe single vessel coronary artery disease status post drug elutingstenting of total proximal right coronary artery  august 2009  . Hypertension   . Chronic kidney disease   . Anemia   . Atrial flutter     transient  . Anxiety   . DJD (degenerative joint disease)     ambulates by wheelchair,  s/p infection of L hip with removal hardware and "spacer"placed  . Bradycardia   . Lupus     hydralazine induced lupus, resolved   Past Surgical History  Procedure Date  . Right femoral pseudoaneur     successfully trated with thrombin injection  . Replacement total knee bilateral   . Lumbar disc surgery   . Cataract surgery   . Total hip arthroplasty     left hip replacement with subsequent infection requiring removal of hardware and "spacer" placed  . Coronary angioplasty     Current Outpatient Prescriptions  Medication Sig Dispense Refill  . ALPRAZolam (XANAX) 1 MG tablet Take 2 mg by mouth at  bedtime as needed.       Marland Kitchen amLODipine (NORVASC) 5 MG tablet Take 5 mg by mouth 2 (two) times daily.        Marland Kitchen aspirin 325 MG tablet Take 325 mg by mouth daily.        . Calcium Carb-Cholecalciferol (CALCIUM 500 +D) 500-400 MG-UNIT TABS Take 1 tablet by mouth daily.        . cloNIDine (CATAPRES) 0.1 MG tablet Take 0.1 mg by mouth as directed. If systolic is greater than 180       . desipramine (NOPRAMIN) 100 MG tablet Take 100 mg by mouth at bedtime.        . docusate sodium (COLACE) 100 MG capsule Take 100 mg by mouth daily as needed.        . ergocalciferol (VITAMIN D2) 50000 UNITS capsule Take 50,000 Units by mouth 2 (two) times a week.        . ferrous sulfate 325 (65 FE) MG tablet Take 325 mg by mouth 2 (two) times daily.        . folic acid (FOLVITE) 1 MG tablet Take 1 mg by mouth daily.        . furosemide (LASIX) 80 MG tablet Take 80 mg by mouth 2 (two) times daily.       . hydroxychloroquine (PLAQUENIL) 200 MG tablet Take 200 mg by mouth 2 (two) times daily.        Marland Kitchen  isosorbide mononitrate (IMDUR) 120 MG 24 hr tablet Take 120 mg by mouth daily.        . Multiple Vitamins-Minerals (CENTRUM SILVER PO) Take 1 tablet by mouth daily.        . nitroGLYCERIN (NITROSTAT) 0.4 MG SL tablet Place 0.4 mg under the tongue every 5 (five) minutes as needed.        Marland Kitchen omeprazole (PRILOSEC) 20 MG capsule Take 10 mg by mouth daily.       . polyethylene glycol (MIRALAX / GLYCOLAX) packet Take 17 g by mouth daily as needed.        . potassium chloride (KLOR-CON) 10 MEQ CR tablet Take 20 mEq by mouth daily.        . QUEtiapine (SEROQUEL) 50 MG tablet Take 50 mg by mouth at bedtime.        Marland Kitchen terazosin (HYTRIN) 5 MG capsule Take 5 mg by mouth at bedtime.          Allergies  Allergen Reactions  . Citalopram     REACTION: diarrhea  . Codeine   . Hydralazine     Drug induced lupus    History   Social History  . Marital Status: Single    Spouse Name: N/A    Number of Children: N/A  . Years of  Education: N/A   Occupational History  . Not on file.   Social History Main Topics  . Smoking status: Never Smoker   . Smokeless tobacco: Never Used  . Alcohol Use: No  . Drug Use: No  . Sexually Active: Not on file   Other Topics Concern  . Not on file   Social History Narrative   Pt lives in [place] alone.Disabled.  Previously supervisory work.    Family History  Problem Relation Age of Onset  . Hypertension      ROS- All systems are reviewed and negative except as per the HPI above  Physical Exam: Filed Vitals:   05/08/11 1157  BP: 187/77  Pulse: 75  Height: 5' 5.5" (1.664 m)  Weight: 202 lb 4 oz (91.74 kg)    GEN- The patient is chronically ill appearing, alert and oriented x 3 today.   Head- normocephalic, atraumatic Eyes-  Sclera clear, conjunctiva pink Ears- hearing intact Oropharynx- clear Neck- supple, no JVP Lymph- no cervical lymphadenopathy Lungs- Clear to ausculation bilaterally, normal work of breathing Heart- Regular rate and rhythm, no murmurs, rubs or gallops, PMI not laterally displaced GI- soft, NT, ND, + BS Extremities- no clubbing, cyanosis,+ edema MS- ambulates by wheelchair today Skin- no rash or lesion Psych- euthymic mood, full affect Neuro- strength and sensation are intact  EKG 04/13/11 reveals sinus rhythm 68 bpm, PR 244, QTc 450, LAD  Assessment and Plan:

## 2011-05-14 ENCOUNTER — Encounter: Payer: Self-pay | Admitting: Internal Medicine

## 2011-05-14 NOTE — Assessment & Plan Note (Signed)
Stable No change required today  

## 2011-05-14 NOTE — Assessment & Plan Note (Addendum)
Mr Raineri presents today for EP consultation regarding bradycardia.  I have reviewed his recent cardionet monitor which reveals episodes of bradycardia with sinus pauses up to 4 seconds in duration.  There is no evidence of advanced AV block.  He has been completely asymptomatic with these and is clear that he has not had presyncope or syncope.  He reports that fatigue has improved off of labetalol. I had a long discussion with the patient today.  We discussed risks, benefits, and alternatives of PPM implantation.  He is very clear that he wishes to avoid PPM.  He has had significant trouble with prior infections and understands his increased risks for infection with PPM.  I think that his concerns are quite valid.  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  If he develops symptoms of presyncope or syncope, then we will have to reconsider.  He states that he presently does not drive and is predominantly confined to his wheelchair.  I would recommend that chronotropic agents be avoided long term. Adequate blood pressure control may actually increase his heart rate as baroreceptor feedback response to hypertensive stimulation likely results in increased vagal tone which contributes to his bradycardia.   I will see him again in 4 weeks in [place] for further evaluation.

## 2011-05-14 NOTE — Assessment & Plan Note (Signed)
As above The patient has very elevated blood pressure which has been refractory to multiple drugs.  I am concerned that desipramine may actually be causing elevation in blood pressure (via Nor Epi reuptake blockade).  Though this medicine often causes hypotension, hypertension can also occur.  In addition, severe elevations of BP have been reported with the combination of TCAs and clonidine. I think that in order to adequately treat his blood pressure, he should try coming off of desipramine.  I have advised that he only attempt this under direction of his primary care physician or psychiatrist.  If he remains hypertensive thereafter, further evaluation of secondary causes of refractory hypertension could be pursued by Dr Andee Lineman.

## 2011-05-16 ENCOUNTER — Other Ambulatory Visit: Payer: Self-pay | Admitting: *Deleted

## 2011-05-19 ENCOUNTER — Ambulatory Visit: Payer: Medicare Other | Admitting: Cardiology

## 2011-05-19 ENCOUNTER — Telehealth: Payer: Self-pay | Admitting: *Deleted

## 2011-05-19 NOTE — Telephone Encounter (Signed)
Vicky English from [facility] left message on voice mail stating pt requested 90 day supply of potassium. She wanted to get verification from [place] that this was okay.  Returned call to [place] but she was gone for the day. Spoke with pharmacist who states they received this ok from pt's primary MD.

## 2011-06-05 ENCOUNTER — Institutional Professional Consult (permissible substitution): Payer: Medicare Other | Admitting: Internal Medicine

## 2011-06-08 ENCOUNTER — Encounter: Payer: Self-pay | Admitting: Cardiology

## 2011-06-08 ENCOUNTER — Ambulatory Visit (INDEPENDENT_AMBULATORY_CARE_PROVIDER_SITE_OTHER): Payer: Medicare Other | Admitting: Cardiology

## 2011-06-08 VITALS — BP 118/70 | HR 87 | Ht 62.0 in | Wt 205.0 lb

## 2011-06-08 DIAGNOSIS — I4892 Unspecified atrial flutter: Secondary | ICD-10-CM | POA: Insufficient documentation

## 2011-06-08 DIAGNOSIS — T50905A Adverse effect of unspecified drugs, medicaments and biological substances, initial encounter: Secondary | ICD-10-CM | POA: Insufficient documentation

## 2011-06-08 DIAGNOSIS — L932 Other local lupus erythematosus: Secondary | ICD-10-CM | POA: Insufficient documentation

## 2011-06-08 DIAGNOSIS — I259 Chronic ischemic heart disease, unspecified: Secondary | ICD-10-CM

## 2011-06-08 DIAGNOSIS — N189 Chronic kidney disease, unspecified: Secondary | ICD-10-CM | POA: Insufficient documentation

## 2011-06-08 DIAGNOSIS — I498 Other specified cardiac arrhythmias: Secondary | ICD-10-CM

## 2011-06-08 DIAGNOSIS — R001 Bradycardia, unspecified: Secondary | ICD-10-CM

## 2011-06-08 DIAGNOSIS — R0602 Shortness of breath: Secondary | ICD-10-CM

## 2011-06-08 DIAGNOSIS — R609 Edema, unspecified: Secondary | ICD-10-CM

## 2011-06-08 DIAGNOSIS — M1990 Unspecified osteoarthritis, unspecified site: Secondary | ICD-10-CM | POA: Insufficient documentation

## 2011-06-08 NOTE — Patient Instructions (Signed)
Your physician recommends that you go to the [facility] for lab work for [employer] & BNP. If the results of your test are normal or stable, you will receive a letter.  If they are abnormal, the nurse will contact you by phone. Your physician wants you to follow up in:  3 months.  You will receive a reminder letter in the mail one-two months in advance.  If you don't receive a letter, please call our office to schedule the follow up appointment

## 2011-06-13 NOTE — Assessment & Plan Note (Signed)
No recurrent atrial arrhythmias. Continue current medical therapy. No significant tachycardia either.

## 2011-06-13 NOTE — Assessment & Plan Note (Signed)
No recurrent chest pain. 

## 2011-06-13 NOTE — Assessment & Plan Note (Signed)
Currently no indication for pacemaker. Continue medical therapy.

## 2011-06-13 NOTE — Progress Notes (Signed)
CC: Followup of a patient with coronary artery disease, status post prior drug-eluting stent placement to the right coronary artery  HPI:  The patient is a 70 year old male with history of single-vessel coronary disease, chronic kidney disease, transient atrial flutter and recently referred for bradycardia to Dr. Johney Frame. CardioNet monitor was reviewed and reported the patient had pauses up to 4 seconds but no evidence of advanced AV block. He already has improved after labetalol was discontinued. Dr. Johney Frame felt there was no clear indication for for permanent pacemaker, particularly light of questionable indolent hip infection. The patient continues to report chronic hip pain. Overall his heart rate seems to be controlled and he reports no tachycardia palpitations. Has occasional edema more so on the left leg than the right. He has been wearing compression stockings. Reportedly there was a rise in his creatinine and is followed by his primary care physician as scheduled with her nephrologist. He also significant arthritic complaints and will see a rheumatologist. The patient is limited in his mobility and most of the time confined to wheelchair. He denies any chest pain shortness of breath orthopnea PND.   PMH: reviewed and listed in Problem List in Electronic Records (and see below)  Allergies/SH/FHX : available in Electronic Records for review  Medications: Current Outpatient Prescriptions  Medication Sig Dispense Refill  . ALPRAZolam (XANAX) 1 MG tablet Take 2 mg by mouth at bedtime as needed.       Marland Kitchen amLODipine (NORVASC) 5 MG tablet Take 5 mg by mouth 2 (two) times daily.        Marland Kitchen aspirin 325 MG tablet Take 325 mg by mouth daily.        . calcium acetate (PHOSLO) 667 MG capsule Take 2 tablets by mouth Three times a day.      . Calcium Carb-Cholecalciferol (CALCIUM 500 +D) 500-400 MG-UNIT TABS Take 1 tablet by mouth daily.        Marland Kitchen desipramine (NOPRAMIN) 100 MG tablet Take 100 mg by mouth at  bedtime.        . Diclofenac Sodium CR 100 MG 24 hr tablet Take 1 tablet by mouth Daily.      Marland Kitchen docusate sodium (COLACE) 100 MG capsule Take 100 mg by mouth daily as needed.        . ergocalciferol (VITAMIN D2) 50000 UNITS capsule Take 50,000 Units by mouth 2 (two) times a week.        . ferrous sulfate 325 (65 FE) MG tablet Take 325 mg by mouth 2 (two) times daily.        . folic acid (FOLVITE) 1 MG tablet Take 1 mg by mouth daily.        . furosemide (LASIX) 80 MG tablet Take 80 mg by mouth 2 (two) times daily.       . hydroxychloroquine (PLAQUENIL) 200 MG tablet Take 200 mg by mouth 2 (two) times daily.        . isosorbide mononitrate (IMDUR) 120 MG 24 hr tablet Take 120 mg by mouth daily.        Marland Kitchen lisinopril (PRINIVIL,ZESTRIL) 20 MG tablet Take 1 tablet by mouth Daily.      . Multiple Vitamins-Minerals (CENTRUM SILVER PO) Take 1 tablet by mouth daily.        . nitroGLYCERIN (NITROSTAT) 0.4 MG SL tablet Place 0.4 mg under the tongue every 5 (five) minutes as needed.        Marland Kitchen omeprazole (PRILOSEC) 20 MG capsule Take 10 mg by  mouth daily.       . polyethylene glycol (MIRALAX / GLYCOLAX) packet Take 17 g by mouth daily as needed.        . potassium chloride (KLOR-CON) 10 MEQ CR tablet Take 20 mEq by mouth daily.        . QUEtiapine (SEROQUEL) 50 MG tablet Take 50 mg by mouth at bedtime.        Marland Kitchen terazosin (HYTRIN) 5 MG capsule Take 5 mg by mouth at bedtime.        . VOLTAREN 1 % GEL Apply 1 application topically Twice daily.        ROS: No nausea or vomiting. No fever or chills.No melena or hematochezia.No bleeding.No claudication  Physical Exam: BP 118/70  Pulse 87  Ht 5\' 2"  (1.575 m)  Wt 205 lb (92.987 kg)  BMI 37.49 kg/m2  SpO2 99% General: Well-nourished white male in no distress Neck: Normal carotid upstroke no carotid bruits. No thyromegaly nonnodular thyroid Lungs: Clear breath sounds bilaterally no wheezing Cardiac: Regular rate and rhythm with normal S1-S2 no murmur rubs or  gallops Vascular: Normal peripheral pulses. Mild lower extremity pitting edema Skin: Warm and dry.  12lead ECG: Limited bedside ECHO:N/A   Assessment and Plan

## 2011-06-21 ENCOUNTER — Other Ambulatory Visit: Payer: Self-pay | Admitting: *Deleted

## 2011-06-21 DIAGNOSIS — R7989 Other specified abnormal findings of blood chemistry: Secondary | ICD-10-CM

## 2011-06-21 DIAGNOSIS — Z79899 Other long term (current) drug therapy: Secondary | ICD-10-CM

## 2011-06-21 MED ORDER — FUROSEMIDE 80 MG PO TABS: 80.0000 mg | ORAL_TABLET | Freq: Every day | ORAL | Status: DC

## 2011-06-22 ENCOUNTER — Ambulatory Visit: Payer: Medicare Other | Admitting: Internal Medicine

## 2011-08-12 IMAGING — NM NM BONE 3 PHASE
1 series · 6 of 6 positions shown · non-contrast
Comparison: None
Correlation:  CT pelvis 10/05/2009

CLINICAL DATA: Left hip and thigh pain, acetabular migration versus
femoral loosening

NUCLEAR MEDICINE THREE PHASE BONE SCAN
TECHNIQUE: Radionuclide angiographic images, immediate static
blood pool images, and 3-hour delayed static images were obtained
after intravenous injection of radiopharmaceutical.
Radiopharmaceutical: 23.5 mCi 0c-EEm MDP

[Series 1: fl flow and static · 4.75mm/px · 6 of 40 frames shown]
[frame 4/40  full-range]
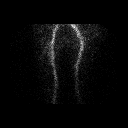
[frame 10/40  full-range]
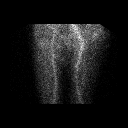
[frame 17/40  full-range]
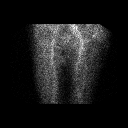
[frame 24/40  full-range]
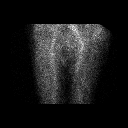
[frame 30/40  full-range]
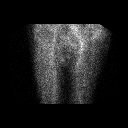
[frame 37/40  full-range]
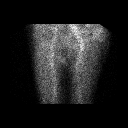

[6 of 6 positions shown; findings below may reference images not displayed]

FINDINGS: Blood flow images demonstrate mildly increased blood flow to the
soft tissue region at the lateral left hip.
Blood pool images demonstrate mildly increased blood pool
localization of tracer lateral to the photopenic defect associated
with the femoral component of the left hip prosthesis.
Additionally, increased blood pool is seen at the internal aspect
of the left pelvis medial and superior to the left acetabulum.

Delayed static images demonstrate significant increased uptake
surrounding the acetabular component of the left hip prosthesis,
which has migrated internally into the left pelvis as seen on the
preceding CT.
In addition, significant delayed increased localization of tracer
is identified in a circumferential fashion lateral to the proximal
left femur, corresponding to an area of calcification on CT.
This may either represent tracer localization within a calcifying
hematoma or a site of myositis ossificans.
No significant tracer localization is identified along the femoral
component of the left hip prosthesis.
No significant uptake identified at right hip joint.
IMPRESSION: Increased blood pool and delayed uptake of tracer surrounding the
migrated acetabular component of the left hip prosthesis, which now
lies internal in the left pelvis at the iliacus muscle status post
pelvic fracture.
Increased blood flow, blood pool and delayed uptake of tracer at an
area of circumferential soft tissue calcification lateral to the
proximal left femur; favor this representing myositis ossificans or
less likely a calcifying hematoma.

## 2011-09-25 ENCOUNTER — Telehealth: Payer: Self-pay | Admitting: *Deleted

## 2011-09-25 MED ORDER — HYDROCODONE-ACETAMINOPHEN 7.5-750 MG PO TABS: ORAL_TABLET | ORAL | Status: DC

## 2011-09-25 MED ORDER — LISINOPRIL 40 MG PO TABS: 40.0000 mg | ORAL_TABLET | Freq: Every day | ORAL | Status: DC

## 2011-09-25 MED ORDER — HYDROCHLOROTHIAZIDE 12.5 MG PO CAPS: 12.5000 mg | ORAL_CAPSULE | Freq: Every day | ORAL | Status: AC

## 2011-09-25 MED ORDER — METOPROLOL SUCCINATE ER 25 MG PO TB24: 25.0000 mg | ORAL_TABLET | Freq: Every day | ORAL | Status: AC

## 2011-09-25 NOTE — Telephone Encounter (Signed)
Message left on voice mail - Pulse consistantly staying too high - around 100's, but goes up with just a little bit of movement - around 130 - 140's.  Returned call - states heart rate has been elevated since OV in November.  Last seen by Nephrologist 2/6.  Per dictation (see media) - Dr. Lanier Clam stopped Norvasc, started Toprol XL 25mg  daily & HCTZ 12.5mg  daily, increased Lisinopril to 40mg  daily, & Lasix 40mg  daily.  States that he has not stopped the Norvasc because he did not remember him telling him to do this.  He also thinks that the Lasix was a typo error.  States he did not change, still conitnuing the 80mg  daily instead of 40mg .   No c/o dizziness or syncope.  Just feels like heart is beating too fast. -  142/73  115 yesterday evening   Does already have OV scheduled for 3/1 with Gene.

## 2011-09-26 NOTE — Telephone Encounter (Signed)
Discussed below with patient.  Scheduled for nurse visit for Thursday, 2/21.

## 2011-09-26 NOTE — Telephone Encounter (Signed)
Scheduled for an R.N. visit as soon as possible to consolidate medication list. Also orthostatics, vital signs and 12-lead electrocardiogram. If everything okay after my review can wait until appointment March 1. If not may need to be seen sooner

## 2011-10-03 ENCOUNTER — Ambulatory Visit (INDEPENDENT_AMBULATORY_CARE_PROVIDER_SITE_OTHER): Payer: Medicare Other | Admitting: *Deleted

## 2011-10-03 VITALS — BP 150/78 | HR 89

## 2011-10-03 DIAGNOSIS — I4892 Unspecified atrial flutter: Secondary | ICD-10-CM

## 2011-10-03 NOTE — Progress Notes (Signed)
Results reviewed by MD and no changes at this time.  Orthostatics reviewed as well as electrocardiogram.  No changes in current plan.  Patient will followup with me in clinic. Peyton Bottoms

## 2011-10-06 ENCOUNTER — Ambulatory Visit: Payer: Medicare Other | Admitting: Physician Assistant

## 2011-10-20 ENCOUNTER — Ambulatory Visit (INDEPENDENT_AMBULATORY_CARE_PROVIDER_SITE_OTHER): Payer: Medicare Other | Admitting: Physician Assistant

## 2011-10-20 ENCOUNTER — Telehealth: Payer: Self-pay | Admitting: *Deleted

## 2011-10-20 ENCOUNTER — Encounter: Payer: Self-pay | Admitting: Physician Assistant

## 2011-10-20 ENCOUNTER — Other Ambulatory Visit: Payer: Self-pay | Admitting: *Deleted

## 2011-10-20 ENCOUNTER — Encounter: Payer: Self-pay | Admitting: *Deleted

## 2011-10-20 VITALS — BP 114/66 | HR 120 | Ht 62.0 in | Wt 199.0 lb

## 2011-10-20 DIAGNOSIS — R0602 Shortness of breath: Secondary | ICD-10-CM

## 2011-10-20 DIAGNOSIS — Z79899 Other long term (current) drug therapy: Secondary | ICD-10-CM

## 2011-10-20 DIAGNOSIS — I4892 Unspecified atrial flutter: Secondary | ICD-10-CM

## 2011-10-20 DIAGNOSIS — Z7982 Long term (current) use of aspirin: Secondary | ICD-10-CM

## 2011-10-20 MED ORDER — DILTIAZEM HCL ER COATED BEADS 120 MG PO CP24: 120.0000 mg | ORAL_CAPSULE | Freq: Every day | ORAL | Status: DC

## 2011-10-20 MED ORDER — RIVAROXABAN 15 MG PO TABS: 15.0000 mg | ORAL_TABLET | Freq: Every day | ORAL | Status: DC

## 2011-10-20 NOTE — Assessment & Plan Note (Addendum)
Patient has recurrent SVT by EKG today, which was reviewed with Dr. Andee Lineman. We feel that this most likely represents an atypical atrial flutter with 2:1 AV conduction. Plan is to arrange for TEE DCCV next week, following load with Xarelto 15 mg daily starting this evening (if GFR greater than 30). ASA to be discontinued. We'll also DC Norvasc and start Cardizem CD 120 mg daily for rate control. Toprol and Cardizem to be held a.m. of procedure.

## 2011-10-20 NOTE — Progress Notes (Addendum)
HPI: Patient presents as an add-on for evaluation of persistently elevated pulse.  Patient last seen here in clinic in November, by Dr. Andee Lineman, at which time he was felt to be in NSR. He has history of transient atrial flutter, and had been recently referred to Dr. Johney Frame for evaluation of bradycardia and 4 second pauses. This improved, however, after labetalol was DC'd; therefore, Dr. Johney Frame found no clear indication for permanent pacemaker implantation.  Over the last 2 to 3 months, however, the patient has noted persistently elevated heart rates in the 120-140 range. He has mild DOE, diminished energy, and lightheadedness, but no frank syncope. He denies any chest pain.  Allergies  Allergen Reactions  . Citalopram     REACTION: diarrhea  . Codeine   . Lovastatin   . Zyvox (Linezolid)   . Hydralazine     Drug induced lupus  . Norvasc (Amlodipine Besylate) Swelling  . Zaroxolyn (Metolazone) Other (See Comments)    Bad urinary incontinence    Current Outpatient Prescriptions  Medication Sig Dispense Refill  . ALPRAZolam (XANAX) 1 MG tablet Take 1 mg by mouth 2 (two) times daily.       Marland Kitchen aspirin 325 MG tablet Take 325 mg by mouth daily.        . calcium acetate (PHOSLO) 667 MG capsule Take 667 mg by mouth Three times a day.       . Calcium Carb-Cholecalciferol (CALCIUM 500 +D) 500-400 MG-UNIT TABS Take 1 tablet by mouth daily.        Marland Kitchen desipramine (NOPRAMIN) 100 MG tablet Take 100 mg by mouth at bedtime.        . docusate sodium (COLACE) 100 MG capsule Take 100 mg by mouth daily as needed.        . ergocalciferol (VITAMIN D2) 50000 UNITS capsule Take 50,000 Units by mouth once a week.       . folic acid (FOLVITE) 1 MG tablet Take 1 mg by mouth daily.        . furosemide (LASIX) 80 MG tablet Take 1 tablet (80 mg total) by mouth daily.      . hydrochlorothiazide (MICROZIDE) 12.5 MG capsule Take 1 capsule (12.5 mg total) by mouth daily.      Marland Kitchen HYDROcodone-acetaminophen (VICODIN ES)  7.5-750 MG per tablet Take 1/2 tab as needed with 1 - 2 Tylenol.      . hydroxychloroquine (PLAQUENIL) 200 MG tablet Take 200 mg by mouth 2 (two) times daily.        . iron polysaccharides (FERREX 150) 150 MG capsule Take 150 mg by mouth 2 (two) times daily.      . isosorbide mononitrate (IMDUR) 120 MG 24 hr tablet Take 120 mg by mouth daily.        Marland Kitchen lisinopril (PRINIVIL,ZESTRIL) 40 MG tablet Take 1 tablet (40 mg total) by mouth daily.      . metoprolol succinate (TOPROL-XL) 25 MG 24 hr tablet Take 1 tablet (25 mg total) by mouth daily.      . Multiple Vitamins-Minerals (CENTRUM SILVER PO) Take 1 tablet by mouth daily.        . niacin 500 MG tablet Take 500 mg by mouth 2 (two) times daily with a meal.      . nitroGLYCERIN (NITROSTAT) 0.4 MG SL tablet Place 0.4 mg under the tongue every 5 (five) minutes as needed.        Marland Kitchen omeprazole (PRILOSEC) 20 MG capsule Take 10 mg by mouth daily.       Marland Kitchen  polyethylene glycol (MIRALAX / GLYCOLAX) packet Take 17 g by mouth daily as needed.        . potassium chloride (KLOR-CON) 10 MEQ CR tablet Take 10 mEq by mouth daily.       . QUEtiapine (SEROQUEL) 50 MG tablet Take 50 mg by mouth at bedtime.        Marland Kitchen terazosin (HYTRIN) 5 MG capsule Take 5 mg by mouth at bedtime.        . VOLTAREN 1 % GEL Apply 1 application topically Twice daily.        Past Medical History  Diagnosis Date  . Normal echocardiogram     normal left ventricular function  . Coronary artery disease     severe single vessel coronary artery disease status post drug elutingstenting of total proximal right coronary artery  august 2009  . Hypertension   . Chronic kidney disease   . Anemia   . Atrial flutter     transient  . Anxiety   . DJD (degenerative joint disease)     ambulates by wheelchair,  s/p infection of L hip with removal hardware and "spacer"placed  . Bradycardia   . Lupus     hydralazine induced lupus, resolved    Past Surgical History  Procedure Date  . Right femoral  pseudoaneur     successfully trated with thrombin injection  . Replacement total knee bilateral   . Lumbar disc surgery   . Cataract surgery   . Total hip arthroplasty     left hip replacement with subsequent infection requiring removal of hardware and "spacer" placed  . Coronary angioplasty     History   Social History  . Marital Status: Single    Spouse Name: N/A    Number of Children: N/A  . Years of Education: N/A   Occupational History  . Not on file.   Social History Main Topics  . Smoking status: Never Smoker   . Smokeless tobacco: Never Used  . Alcohol Use: No  . Drug Use: No  . Sexually Active: Not on file   Other Topics Concern  . Not on file   Social History Narrative   Pt lives in [place] alone.Disabled.  Previously supervisory work.    Family History  Problem Relation Age of Onset  . Hypertension      ROS: no nausea, vomiting; no fever, chills; no melena, hematochezia; no claudication  PHYSICAL EXAM: BP 114/66  Pulse 120  Ht 5\' 2"  (1.575 m)  Wt 199 lb (90.266 kg)  BMI 36.40 kg/m2  SpO2 98% GENERAL: 71 year old, sitting upright; NAD HEENT: NCAT, PERRLA, EOMI; sclera clear; no xanthelasma NECK: palpable bilateral carotid pulses, no bruits; no JVD; no TM LUNGS: CTA bilaterally CARDIAC: Irregularly irregular (S1, S2); no significant murmurs; no rubs or gallops ABDOMEN: soft, non-tender; intact BS EXTREMETIES: trace peripheral edema SKIN: warm/dry; no obvious rash/lesions MUSCULOSKELETAL: no joint deformity NEURO: no focal deficit; NL affect   EKG: reviewed and available in Electronic Records   ASSESSMENT & PLAN: Patient seen and examined with Gene Dyer Klug, PA-C.  Counseling was provided regarding the current medical condition and included: . Diagnosis, impressions, prognosis, recommended diagnostic studies  . Risks and benefits of treatment options  . Instructions for management, treatment and/or follow-up care  . Importance of  compliance with treatment, risk factor reduction  . Patient and/or family education    Time spent counseling was 15 minutes and recorded in the Problem List docuneted by Gene Marjo Grosvenor , PA-C  Risks of the Procedure Possible risks associated with a transesophageal echocardiogram include, but are not limited to, the following:   breathing problems   heart rhythm problems   infection of the heart valves   bleeding of the esophagus Patients with known problems of the esophagus, such as esophageal varices, esophageal obstruction, or radiation therapy to the area of the esophagus should be evaluated carefully by the physician before having the procedure. Patients who are allergic to or sensitive to medications or latex should notify their physician. If you are pregnant or suspect that you may be pregnant, you should notify your physician. There may be other risks depending upon your specific medical condition. Be sure to discuss any concerns with your physician prior to the procedure.  The following risks regarding cardioversion were discussed with the patient:  Although uncommon, cardioversion does have risks. The procedure can sometimes worsen arrhythmias. Rarely, it can cause life-threatening arrhythmias. These irregular heartbeats are treated with electrical shocks or medicines.  Cardioversion can dislodge blood clots in the heart. These clots can travel to organs and tissues in the body and cause a stroke or other problems. Taking anticlotting medicines before and after cardioversion reduces this risk.  Refoel Critchley [facility] 10/30/2011 8:12 PM

## 2011-10-20 NOTE — Telephone Encounter (Signed)
Pt has Medicare and [employer], no precert required.

## 2011-10-20 NOTE — Assessment & Plan Note (Signed)
Stable on current medication regimen 

## 2011-10-20 NOTE — Patient Instructions (Addendum)
Your physician recommends that you go to Outpatient Registration at [facility] for lab work today: BMET/CBC/PT/PTT/TSH. Start Cardizem (diltiazem) 120 mg daily. Stop Amlodipine.  Once we have lab results and notify you it is okay, start Xarelto 15 mg daily. You will stop Aspirin the day you start Xarelto.

## 2011-10-20 NOTE — Assessment & Plan Note (Signed)
Quiescent on current medication regimen. 

## 2011-10-20 NOTE — Telephone Encounter (Signed)
Precert TEE/DCCV at [facility] on 3/25

## 2011-10-20 NOTE — Assessment & Plan Note (Signed)
Will check stat labs for assessment of GFR, prior to initiation of Xarelto.

## 2011-10-23 ENCOUNTER — Telehealth: Payer: Self-pay | Admitting: *Deleted

## 2011-10-23 DIAGNOSIS — I4892 Unspecified atrial flutter: Secondary | ICD-10-CM

## 2011-10-23 DIAGNOSIS — Z7901 Long term (current) use of anticoagulants: Secondary | ICD-10-CM | POA: Insufficient documentation

## 2011-10-23 MED ORDER — WARFARIN SODIUM 5 MG PO TABS: ORAL_TABLET | ORAL | Status: AC

## 2011-10-23 NOTE — Telephone Encounter (Signed)
Pt notified of results and verbalized understanding. However, pt's insurance denied Xarelto. Prior-auth was not requested b/c response from insurance was that Xarelto was not covered. Please advise further.

## 2011-10-23 NOTE — Telephone Encounter (Signed)
Left message to notify pt to start Coumadin 5 mg this evening and call the office to schedule CCR visit for this Friday.

## 2011-10-23 NOTE — Telephone Encounter (Signed)
Message copied by Arlyss Gandy on Mon Oct 23, 2011  3:47 PM ------      Message from: Rande Brunt      Created: Mon Oct 23, 2011  3:39 PM       Discussed with GD. Start Coumadin 5 mg daily, and check PT/INR this Friday, 3/22.

## 2011-10-23 NOTE — Telephone Encounter (Signed)
Pt notified and verbalized understanding. Scheduled for CCR on 3/22 at 10 am.

## 2011-10-23 NOTE — Telephone Encounter (Signed)
Message copied by Arlyss Gandy on Mon Oct 23, 2011  2:28 PM ------      Message from: Prescott Parma C      Created: Mon Oct 23, 2011 11:11 AM       Stable CKD with creatinine 1.8 (GFR 36). Ok to proceed with Xarelto at 15 mg daily, as recommended, and with scheduled DCCV.

## 2011-10-27 ENCOUNTER — Ambulatory Visit (INDEPENDENT_AMBULATORY_CARE_PROVIDER_SITE_OTHER): Payer: Medicare Other | Admitting: *Deleted

## 2011-10-27 DIAGNOSIS — Z7901 Long term (current) use of anticoagulants: Secondary | ICD-10-CM

## 2011-10-27 DIAGNOSIS — I4892 Unspecified atrial flutter: Secondary | ICD-10-CM

## 2011-10-27 LAB — POCT INR: INR: 1.8

## 2011-10-30 ENCOUNTER — Encounter: Payer: Self-pay | Admitting: Cardiology

## 2011-10-30 ENCOUNTER — Telehealth: Payer: Self-pay | Admitting: Cardiology

## 2011-10-30 DIAGNOSIS — I4892 Unspecified atrial flutter: Secondary | ICD-10-CM

## 2011-10-30 NOTE — Telephone Encounter (Signed)
DENISE WITH [facility]  CALLED TO LET us KNOW THAT Mehtab TOLD HER THAT HE WAS SCHEDULED TO HAVE A BACK INJECTION TOMORROW.  SHE QUESTION THE FACT THAT HE WAS ON COUMADIN AND EXPLAINED THAT HE NEEDED NOT TO HAVE THE INJECTION.

## 2011-10-30 NOTE — Telephone Encounter (Signed)
Called pt.  He had DCCV today.  States everything went well.  Has appt for INR check with me tomorrow.  Asked him about pending back injection scheduled for tomorrow.  Pt said daughter has called and canceled that appt.   Informed pt he would need to be off coumadin for that procedure and we would not want him off coumadin for the next month at least.

## 2011-10-31 ENCOUNTER — Ambulatory Visit (INDEPENDENT_AMBULATORY_CARE_PROVIDER_SITE_OTHER): Payer: Medicare Other | Admitting: *Deleted

## 2011-10-31 DIAGNOSIS — I4892 Unspecified atrial flutter: Secondary | ICD-10-CM

## 2011-10-31 DIAGNOSIS — Z7901 Long term (current) use of anticoagulants: Secondary | ICD-10-CM

## 2011-10-31 LAB — POCT INR: INR: 2.7

## 2011-11-07 ENCOUNTER — Ambulatory Visit (INDEPENDENT_AMBULATORY_CARE_PROVIDER_SITE_OTHER): Payer: Medicare Other | Admitting: *Deleted

## 2011-11-07 DIAGNOSIS — Z7901 Long term (current) use of anticoagulants: Secondary | ICD-10-CM

## 2011-11-07 DIAGNOSIS — I4892 Unspecified atrial flutter: Secondary | ICD-10-CM

## 2011-11-24 ENCOUNTER — Ambulatory Visit (INDEPENDENT_AMBULATORY_CARE_PROVIDER_SITE_OTHER): Payer: Medicare Other | Admitting: *Deleted

## 2011-11-24 DIAGNOSIS — Z7901 Long term (current) use of anticoagulants: Secondary | ICD-10-CM

## 2011-11-24 DIAGNOSIS — I4892 Unspecified atrial flutter: Secondary | ICD-10-CM

## 2011-11-24 LAB — POCT INR: INR: 1.5

## 2011-11-29 ENCOUNTER — Encounter: Payer: Self-pay | Admitting: Physician Assistant

## 2011-11-29 ENCOUNTER — Ambulatory Visit (INDEPENDENT_AMBULATORY_CARE_PROVIDER_SITE_OTHER): Payer: Medicare Other | Admitting: *Deleted

## 2011-11-29 ENCOUNTER — Ambulatory Visit (INDEPENDENT_AMBULATORY_CARE_PROVIDER_SITE_OTHER): Payer: Medicare Other | Admitting: Physician Assistant

## 2011-11-29 ENCOUNTER — Ambulatory Visit: Payer: Self-pay | Admitting: *Deleted

## 2011-11-29 VITALS — BP 114/76 | HR 68 | Ht 62.0 in | Wt 202.0 lb

## 2011-11-29 DIAGNOSIS — Z7901 Long term (current) use of anticoagulants: Secondary | ICD-10-CM

## 2011-11-29 DIAGNOSIS — I4892 Unspecified atrial flutter: Secondary | ICD-10-CM

## 2011-11-29 DIAGNOSIS — I10 Essential (primary) hypertension: Secondary | ICD-10-CM

## 2011-11-29 DIAGNOSIS — R001 Bradycardia, unspecified: Secondary | ICD-10-CM

## 2011-11-29 DIAGNOSIS — I498 Other specified cardiac arrhythmias: Secondary | ICD-10-CM

## 2011-11-29 LAB — POCT INR: INR: 3.3

## 2011-11-29 MED ORDER — ASPIRIN EC 81 MG PO TBEC: 81.0000 mg | DELAYED_RELEASE_TABLET | Freq: Every day | ORAL | Status: AC

## 2011-11-29 NOTE — Progress Notes (Signed)
HPI: Patient presents for followup, following recent TEE-guided DCCV, by Dr. Andee Lineman, on March 25. He was found to be in a recurrent SVT rhythm, when last seen here in clinic, and which we felt to be atypical atrial flutter (2:1 conduction).  He was successfully converted to NSR with 100 joules, Dr Andee Lineman, and arrangements made for weekly f/u INRs in our Coumadin clinic. He returns today back in A flutter, albeit now with CVR. He is also feeling better symptomatically, with less SOB and tachypalpitations.  12 lead EKG today, reviewed by me, indicates probable atypical atrial flutter at 78 bpm.  Allergies  Allergen Reactions  . Citalopram     REACTION: diarrhea  . Codeine   . Lovastatin   . Zyvox (Linezolid)   . Hydralazine     Drug induced lupus  . Norvasc (Amlodipine Besylate) Swelling  . Zaroxolyn (Metolazone) Other (See Comments)    Bad urinary incontinence    Current Outpatient Prescriptions  Medication Sig Dispense Refill  . ALPRAZolam (XANAX) 1 MG tablet Take 1 mg by mouth 2 (two) times daily.       Marland Kitchen aspirin 325 MG tablet Take 325 mg by mouth daily.      . calcium acetate (PHOSLO) 667 MG capsule Take 667 mg by mouth Three times a day.       . Calcium Carb-Cholecalciferol (CALCIUM 500 +D) 500-400 MG-UNIT TABS Take 1 tablet by mouth daily.        Marland Kitchen desipramine (NOPRAMIN) 100 MG tablet Take 100 mg by mouth at bedtime.        Marland Kitchen diltiazem (CARDIZEM CD) 120 MG 24 hr capsule Take 1 capsule (120 mg total) by mouth daily.  30 capsule  6  . docusate sodium (COLACE) 100 MG capsule Take 100 mg by mouth daily as needed.        . ergocalciferol (VITAMIN D2) 50000 UNITS capsule Take 50,000 Units by mouth once a week.       . folic acid (FOLVITE) 1 MG tablet Take 1 mg by mouth daily.        . furosemide (LASIX) 80 MG tablet Take 1 tablet (80 mg total) by mouth daily.      . hydrochlorothiazide (MICROZIDE) 12.5 MG capsule Take 1 capsule (12.5 mg total) by mouth daily.      Marland Kitchen  HYDROcodone-acetaminophen (VICODIN ES) 7.5-750 MG per tablet Take 1/2 tab as needed with 1 - 2 Tylenol.      . hydroxychloroquine (PLAQUENIL) 200 MG tablet Take 200 mg by mouth 2 (two) times daily.        . iron polysaccharides (FERREX 150) 150 MG capsule Take 150 mg by mouth 2 (two) times daily.      . isosorbide mononitrate (IMDUR) 120 MG 24 hr tablet Take 120 mg by mouth daily.        Marland Kitchen lisinopril (PRINIVIL,ZESTRIL) 40 MG tablet Take 1 tablet (40 mg total) by mouth daily.      . metoprolol succinate (TOPROL-XL) 25 MG 24 hr tablet Take 1 tablet (25 mg total) by mouth daily.      . Multiple Vitamins-Minerals (CENTRUM SILVER PO) Take 1 tablet by mouth daily.        . niacin 500 MG tablet Take 500 mg by mouth 2 (two) times daily with a meal.      . nitroGLYCERIN (NITROSTAT) 0.4 MG SL tablet Place 0.4 mg under the tongue every 5 (five) minutes as needed.        Marland Kitchen  omeprazole (PRILOSEC) 20 MG capsule Take 10 mg by mouth daily.       . polyethylene glycol (MIRALAX / GLYCOLAX) packet Take 17 g by mouth daily as needed.        . potassium chloride (KLOR-CON) 10 MEQ CR tablet Take 10 mEq by mouth daily.       . QUEtiapine (SEROQUEL) 50 MG tablet Take 50 mg by mouth at bedtime.        . Rivaroxaban (XARELTO) 15 MG TABS tablet Take 1 tablet (15 mg total) by mouth daily.  30 tablet  6  . terazosin (HYTRIN) 5 MG capsule Take 5 mg by mouth at bedtime.        . VOLTAREN 1 % GEL Apply 1 application topically Twice daily.      Marland Kitchen warfarin (COUMADIN) 5 MG tablet Take 1-2 tablets daily as directed by the Anticoagulation Clinic  45 tablet  3  . DISCONTD: amLODipine (NORVASC) 5 MG tablet Take 5 mg by mouth 2 (two) times daily.        Marland Kitchen DISCONTD: ferrous sulfate 325 (65 FE) MG tablet Take 325 mg by mouth 2 (two) times daily.          Past Medical History  Diagnosis Date  . Normal echocardiogram     normal left ventricular function  . Coronary artery disease     severe single vessel coronary artery disease status  post drug elutingstenting of total proximal right coronary artery  august 2009  . Hypertension   . Chronic kidney disease   . Anemia   . Atrial flutter     transient  . Anxiety   . DJD (degenerative joint disease)     ambulates by wheelchair,  s/p infection of L hip with removal hardware and "spacer"placed  . Bradycardia   . Lupus     hydralazine induced lupus, resolved    Past Surgical History  Procedure Date  . Right femoral pseudoaneur     successfully trated with thrombin injection  . Replacement total knee bilateral   . Lumbar disc surgery   . Cataract surgery   . Total hip arthroplasty     left hip replacement with subsequent infection requiring removal of hardware and "spacer" placed  . Coronary angioplasty     History   Social History  . Marital Status: Single    Spouse Name: N/A    Number of Children: N/A  . Years of Education: N/A   Occupational History  . Not on file.   Social History Main Topics  . Smoking status: Never Smoker   . Smokeless tobacco: Never Used  . Alcohol Use: No  . Drug Use: No  . Sexually Active: Not on file   Other Topics Concern  . Not on file   Social History Narrative   Pt lives in [place] alone.Disabled.  Previously supervisory work.    Family History  Problem Relation Age of Onset  . Hypertension      ROS: no nausea, vomiting; no fever, chills; no melena, hematochezia; no claudication  PHYSICAL EXAM: BP 114/76  Pulse 68  Ht 5\' 2"  (1.575 m)  Wt 202 lb (91.627 kg)  BMI 36.95 kg/m2 GENERAL: 71 year old, sitting upright; NAD  HEENT: NCAT, PERRLA, EOMI; sclera clear; no xanthelasma  NECK: palpable bilateral carotid pulses, no bruits; no JVD; no TM  LUNGS: CTA bilaterally  CARDIAC: Irregularly irregular (S1, S2); no significant murmurs; no rubs or gallops  ABDOMEN: Protuberant EXTREMETIES: trace peripheral edema  SKIN: warm/dry; no obvious rash/lesions  MUSCULOSKELETAL: no joint deformity  NEURO: no focal  deficit; NL affect t   EKG: reviewed and available in Electronic Records   ASSESSMENT & PLAN:

## 2011-11-29 NOTE — Progress Notes (Signed)
Addended by: Arlyss Gandy on: 11/29/2011 01:49 PM   Modules accepted: Orders

## 2011-11-29 NOTE — Assessment & Plan Note (Signed)
Pt has reverted back to atypical atrial flutter, but now with CVR, after initial restoration to NSR, by Dr DeGent on 10/30/11, with TEE-guided DCCV. Plan is to continue rate control with Toprol and Cardizem, and to remain on Coumadin anticoagulation, indefinitely. Given this, I instructed him to decrease ASA to 81 mg daily. He has stable CAD, but reports h/o TIA, etiology unknown. 

## 2011-11-29 NOTE — Assessment & Plan Note (Signed)
Patient is due for a repeat PT/INR level today, and will continue to be monitored here in our clinic.

## 2011-11-29 NOTE — Patient Instructions (Signed)
Follow up in 2 months. Decrease Aspirin to 81 mg daily. Have Dr. Maryellen Pile check PT/INR (Coumadin) in 2 weeks!

## 2011-11-29 NOTE — Assessment & Plan Note (Signed)
Very well controlled on current medication regimen 

## 2011-11-29 NOTE — Assessment & Plan Note (Deleted)
Pt has reverted back to atypical atrial flutter, but now with CVR, after initial restoration to NSR, by Dr Andee Lineman on 10/30/11, with TEE-guided DCCV. Plan is to continue rate control with Toprol and Cardizem, and to remain on Coumadin anticoagulation, indefinitely. Given this, I instructed him to decrease ASA to 81 mg daily. He has stable CAD, but reports h/o TIA, etiology unknown.

## 2011-12-05 ENCOUNTER — Telehealth: Payer: Self-pay | Admitting: *Deleted

## 2011-12-05 NOTE — Telephone Encounter (Signed)
Blood pressures running 183/108  - HR been in 130's today.  Did go to PMD Maryellen Pile) today.  Patient states he took extra Metoprolol before going to visit & PMD advised him to take another extra Metoprolol after that.  Did not do EKG, but was insturcted to contact oour offiice.

## 2011-12-05 NOTE — Telephone Encounter (Signed)
Patient notified and verbalized understanding.  ED notified .

## 2011-12-05 NOTE — Telephone Encounter (Signed)
Suggest he comes to the emergency room. I suspect that his back in atrial flutter with 2:1 conduction. This will be difficult to control with by mouth medications. He may need another cardioversion also I think he can only make those decisions if I see me in the emergency room. Notify emergency room immediately see her before 6 PM I can see me in the emergency room.

## 2011-12-21 ENCOUNTER — Telehealth: Payer: Self-pay | Admitting: *Deleted

## 2011-12-21 NOTE — Telephone Encounter (Signed)
Patient called with questions / concerns about his medications & blood pressure readings.    BP readings since discharge from [facility] on 5/8:  5/8 - 129/65   83 5/9 -  77/50    81 (first full day home) 5/10 - 101/59   92 5/11 - 113/64   91 5/12 - 141/78   80 5/13 - 155/79   79 5/14 - 108/63   90   5/15 - 143/61  63     Metoprolol is prescribed at 50mg  - taking 1/2 tab daily - has been doing wrong & taking whole tablet daily up until 5/14.  Also, see updated medication list (updated with list that was faxed by daughter).

## 2011-12-22 NOTE — Telephone Encounter (Signed)
No change in medications for now

## 2011-12-22 NOTE — Telephone Encounter (Signed)
Patient notified via answering machine.   

## 2012-02-01 ENCOUNTER — Encounter: Payer: Self-pay | Admitting: Cardiology

## 2012-02-01 ENCOUNTER — Ambulatory Visit (INDEPENDENT_AMBULATORY_CARE_PROVIDER_SITE_OTHER): Payer: Medicare Other | Admitting: Cardiology

## 2012-02-01 VITALS — BP 139/57 | HR 49 | Resp 18 | Ht 64.0 in | Wt 216.0 lb

## 2012-02-01 DIAGNOSIS — I4891 Unspecified atrial fibrillation: Secondary | ICD-10-CM

## 2012-02-01 MED ORDER — HYDROCODONE-ACETAMINOPHEN 7.5-750 MG PO TABS: 1.0000 | ORAL_TABLET | Freq: Three times a day (TID) | ORAL | Status: AC | PRN

## 2012-02-01 MED ORDER — OXYCODONE HCL 5 MG PO CAPS: 5.0000 mg | ORAL_CAPSULE | Freq: Four times a day (QID) | ORAL | Status: AC | PRN

## 2012-02-01 MED ORDER — CLONIDINE HCL 0.1 MG PO TABS: ORAL_TABLET | ORAL | Status: AC

## 2012-02-01 NOTE — Assessment & Plan Note (Signed)
Stable on Seroquel.

## 2012-02-01 NOTE — Assessment & Plan Note (Addendum)
Combination therapy of labetalol and clonidine was discontinued. However for his blood pressure control I think we can use clonidine on a when necessary basis. Continue Cardizem 120 mg by mouth daily for now

## 2012-02-01 NOTE — Assessment & Plan Note (Signed)
Patient is unable to afford Xarelto and his insurance did not cover the cost of the drug. He is back on warfarin and followed by his primary care physician.

## 2012-02-01 NOTE — Assessment & Plan Note (Signed)
Last CardioNet monitor 2012. More recently patient in atrial fibrillation and anticoagulation with warfarin. The patient has tachybradycardia syndrome on his blood pressure monitor he has heart rates documented as low as 39 beats per minute this is associated with weakness and fatigue and dizziness. After pain control we will obtain a 48-hour Holter monitor to see the patient is possibly pacemaker candidate because of tachybradycardia syndrome. However his blood pressure may fluctuate largely due to inadequate pain control

## 2012-02-01 NOTE — Patient Instructions (Signed)
   Holter monitor - will schedule for about 2 weeks from now  OxyIR 5mg  - may take one tab every 6 hours as needed for severe break thru pain   Vicodin - take around the clock (three times per day)  Clonidine 0.1mg  - may take one tab if needed for SBP(top number) greater than 180.  Max one tab in 24 hours.   Follow up in 3 months

## 2012-02-01 NOTE — Assessment & Plan Note (Signed)
Management with Plaquenil

## 2012-02-01 NOTE — Progress Notes (Signed)
Luis Bottoms, MD, [facility]IM Board Certified in Adult Cardiovascular Medicine,Internal Medicine and Critical Care Medicine    CC: Atrial fibrillation with failed cardioversion.  HPI:  The patient is 71 year old male with history of coronary artery disease, status post stent placement 2009, difficult to control hypertension, atrial fibrillation status post failed cardioversion. The former 2 problems are stable the latter however is not. The patient has had variable heart rates both low into the 30s and higher rates into the 90s to 100s. His blood pressure is also fluctuating greatly. However according to his daughter this appears to be dependent on his level of pain. The patient has severe hip pain status post hip replacement with infection. He also has chronic back pain which is poorly controlled. The patient reports no heart failure symptoms. His echocardiogram shows normal LV function.  PMH: reviewed and listed in Problem List in Electronic Records (and see below) Past Medical History  Diagnosis Date  . Normal echocardiogram     normal left ventricular function  . Coronary artery disease     severe single vessel coronary artery disease status post drug elutingstenting of total proximal right coronary artery  august 2009  . Hypertension   . Chronic kidney disease   . Anemia   . Atrial flutter     transient  . Anxiety   . DJD (degenerative joint disease)     ambulates by wheelchair,  s/p infection of L hip with removal hardware and "spacer"placed  . Bradycardia   . Lupus     hydralazine induced lupus, resolved   Past Surgical History  Procedure Date  . Right femoral pseudoaneur     successfully trated with thrombin injection  . Replacement total knee bilateral   . Lumbar disc surgery   . Cataract surgery   . Total hip arthroplasty     left hip replacement with subsequent infection requiring removal of hardware and "spacer" placed  . Coronary angioplasty      Allergies/SH/FHX : available in Electronic Records for review  Allergies  Allergen Reactions  . Citalopram     REACTION: diarrhea  . Codeine   . Lovastatin   . Zyvox (Linezolid)   . Hydralazine     Drug induced lupus  . Norvasc (Amlodipine Besylate) Swelling  . Zaroxolyn (Metolazone) Other (See Comments)    Bad urinary incontinence   History   Social History  . Marital Status: Single    Spouse Name: N/A    Number of Children: N/A  . Years of Education: N/A   Occupational History  . Not on file.   Social History Main Topics  . Smoking status: Never Smoker   . Smokeless tobacco: Never Used  . Alcohol Use: No  . Drug Use: No  . Sexually Active: Not on file   Other Topics Concern  . Not on file   Social History Narrative   Pt lives in [place] alone.Disabled.  Previously supervisory work.   Family History  Problem Relation Age of Onset  . Hypertension      Medications: Current Outpatient Prescriptions  Medication Sig Dispense Refill  . acetaminophen (TYLENOL) 325 MG tablet Take 325 mg by mouth as needed.      . ALPRAZolam (XANAX) 1 MG tablet Take 1 mg by mouth at bedtime as needed.       Marland Kitchen aspirin EC 81 MG tablet Take 1 tablet (81 mg total) by mouth daily.      . calcium acetate (PHOSLO) 667  MG capsule Take 667 mg by mouth Three times a day.       . Calcium Carb-Cholecalciferol (CALCIUM 500 +D) 500-400 MG-UNIT TABS Take 1 tablet by mouth daily.        Marland Kitchen desipramine (NORPRAMIN) 50 MG tablet Take 100 mg by mouth at bedtime.      Marland Kitchen diltiazem (CARDIZEM CD) 120 MG 24 hr capsule Take 120 mg by mouth daily.      Marland Kitchen docusate sodium (COLACE) 100 MG capsule Take 100 mg by mouth daily as needed.        . ergocalciferol (VITAMIN D2) 50000 UNITS capsule Take 50,000 Units by mouth once a week.       . folic acid (FOLVITE) 1 MG tablet Take 1 mg by mouth daily.        . furosemide (LASIX) 80 MG tablet Take 80 mg by mouth 2 (two) times daily.      . hydrochlorothiazide  (MICROZIDE) 12.5 MG capsule Take 1 capsule (12.5 mg total) by mouth daily.      Marland Kitchen HYDROcodone-acetaminophen (VICODIN ES) 7.5-750 MG per tablet Take 1/2 tab as needed with 1 - 2 Tylenol.      . hydroxychloroquine (PLAQUENIL) 200 MG tablet Take 200 mg by mouth 2 (two) times daily.        . iron polysaccharides (FERREX 150) 150 MG capsule Take 150 mg by mouth 2 (two) times daily.      . isosorbide mononitrate (IMDUR) 120 MG 24 hr tablet Take 120 mg by mouth daily.        Marland Kitchen lisinopril (PRINIVIL,ZESTRIL) 40 MG tablet Take 1 tablet (40 mg total) by mouth daily.      . Magnesium 100 MG CAPS Take 1 capsule by mouth daily.      . metoprolol succinate (TOPROL-XL) 25 MG 24 hr tablet Take 1 tablet (25 mg total) by mouth daily.      . Multiple Vitamins-Minerals (CENTRUM SILVER PO) Take 1 tablet by mouth daily.        . niacin 500 MG tablet Take 500 mg by mouth 2 (two) times daily with a meal.      . nitroGLYCERIN (NITROSTAT) 0.4 MG SL tablet Place 0.4 mg under the tongue every 5 (five) minutes as needed.        Marland Kitchen omeprazole (PRILOSEC) 20 MG capsule Take 10 mg by mouth daily.       . polyethylene glycol (MIRALAX / GLYCOLAX) packet Take 17 g by mouth daily as needed.        . potassium chloride (KLOR-CON) 10 MEQ CR tablet Take 10 mEq by mouth daily.       . QUEtiapine (SEROQUEL) 50 MG tablet Take 50 mg by mouth at bedtime.        Marland Kitchen terazosin (HYTRIN) 5 MG capsule Take 5 mg by mouth at bedtime.        . VOLTAREN 1 % GEL Apply 1 application topically Twice daily.      Marland Kitchen warfarin (COUMADIN) 5 MG tablet Take 1-2 tablets daily as directed by the Anticoagulation Clinic  45 tablet  3  . DISCONTD: amLODipine (NORVASC) 5 MG tablet Take 5 mg by mouth 2 (two) times daily.        Marland Kitchen DISCONTD: ferrous sulfate 325 (65 FE) MG tablet Take 325 mg by mouth 2 (two) times daily.          ROS: No nausea or vomiting. No fever or chills.No melena or hematochezia.No bleeding.No claudication  Physical  Exam: BP 139/57  Pulse 49   Resp 18  Ht 5\' 4"  (1.626 m)  Wt 216 lb (97.977 kg)  BMI 37.08 kg/m2 General: Well-nourished white male sitting in wheelchair unable to ambulate Neck: Normal carotid upstroke no carotid bruits. No thyromegaly nonnodular thyroid Lungs: Clear breath sounds bilaterally no wheezing Cardiac: Irregular rate and rhythm with normal S1-S2 no murmur rubs or gallops. Vascular: No edema.normal distal pulses. Skin: Warm and dry Physcologic: Slightly depressed affect  12lead ECG: Not obtained Limited bedside ECHO:N/A No images are attached to the encounter.   Assessment and Plan  ANXIETY DEPRESSION Stable on Seroquel  Atrial flutter Last CardioNet monitor 2012. More recently patient in atrial fibrillation and anticoagulation with warfarin. The patient has tachybradycardia syndrome on his blood pressure monitor he has heart rates documented as low as 39 beats per minute this is associated with weakness and fatigue and dizziness. After pain control we will obtain a 48-hour Holter monitor to see the patient is possibly pacemaker candidate because of tachybradycardia syndrome. However his blood pressure may fluctuate largely due to inadequate pain control  Bradycardia Combination therapy of labetalol and clonidine was discontinued. However for his blood pressure control I think we can use clonidine on a when necessary basis. Continue Cardizem 120 mg by mouth daily for now  Drug-induced lupus erythematosus Management with Plaquenil  Encounter for long-term (current) use of anticoagulants Patient is unable to afford Xarelto and his insurance did not cover the cost of the drug. He is back on warfarin and followed by his primary care physician.   Patient Active Problem List  Diagnosis  . ANEMIA  . ANXIETY DEPRESSION  . Essential hypertension, benign  . CORONARY ARTERY DISEASE, S/P PTCA  . SPINAL STENOSIS  . MEMORY LOSS  . Edema  . PALPITATIONS, HX OF  . HIP REPLACEMENT, HX OF  . Bradycardia    . Dyslipidemia  . Drug-induced lupus erythematosus  . Arthritis  . Chronic kidney disease  . Atrial flutter  . Encounter for long-term (current) use of anticoagulants

## 2012-02-15 ENCOUNTER — Encounter: Payer: Self-pay | Admitting: *Deleted

## 2012-02-15 ENCOUNTER — Encounter (INDEPENDENT_AMBULATORY_CARE_PROVIDER_SITE_OTHER): Payer: Medicare Other | Admitting: *Deleted

## 2012-02-15 DIAGNOSIS — Z7901 Long term (current) use of anticoagulants: Secondary | ICD-10-CM

## 2012-02-15 DIAGNOSIS — I4891 Unspecified atrial fibrillation: Secondary | ICD-10-CM

## 2012-02-15 DIAGNOSIS — R0989 Other specified symptoms and signs involving the circulatory and respiratory systems: Secondary | ICD-10-CM

## 2012-02-15 DIAGNOSIS — I4892 Unspecified atrial flutter: Secondary | ICD-10-CM

## 2012-02-15 NOTE — Progress Notes (Unsigned)
Patient brought back into office after being found on ground outside as he was heading to his vehicle. Patient states that he did not pass out or feel lightheaded. Patient states that his left leg and foot became very weak and he lost his balance. Patient alert and oriented to person,place and time. Awaiting daughter to pick patient up after being called by nurse.

## 2012-02-16 NOTE — Progress Notes (Unsigned)
Noted  

## 2012-02-23 ENCOUNTER — Telehealth: Payer: Self-pay | Admitting: *Deleted

## 2012-02-23 NOTE — Telephone Encounter (Signed)
Call placed to patient to notify of normal holter monitor results per Gene Serpe, PA.    Patient voiced concerns over his blood pressure readings and heart rate.  States BP is up and down & heart rate is in the 30-40's occasionally.  States that he has been holding his Cardizem & spacing meds out.  Advised him to take meds as prescribed at last OV.  Keep BP & HR log over next 2 weeks & call with readings.  If HR is consistently falling in the 30's - 40's to call office to notify before then.  He verbalized understanding.

## 2012-03-07 ENCOUNTER — Telehealth: Payer: Self-pay | Admitting: *Deleted

## 2012-03-07 NOTE — Telephone Encounter (Signed)
Coumadin can be held for 5 days prior to procedure, then resume at previous does and f/u with primary MD 2-3 days later for PT/INR.

## 2012-03-07 NOTE — Telephone Encounter (Signed)
Patient called to give update on blood pressure readings.  States he changed to taking pain pill in the morning and everything as layed out previously.    Will be getting injections in back soon & needs to know if he can come off Warfarin and/or Aspirin.  Requesting to hold x 7 days prior.

## 2012-03-11 NOTE — Telephone Encounter (Signed)
Patient notified and verbalized understanding.  States PMD Maryellen Pile) is managing coumadin currently.

## 2012-04-10 ENCOUNTER — Ambulatory Visit: Payer: Medicare Other | Admitting: Physician Assistant

## 2012-04-29 ENCOUNTER — Ambulatory Visit (INDEPENDENT_AMBULATORY_CARE_PROVIDER_SITE_OTHER): Payer: Medicare Other | Admitting: Physician Assistant

## 2012-04-29 ENCOUNTER — Encounter: Payer: Self-pay | Admitting: Physician Assistant

## 2012-04-29 ENCOUNTER — Ambulatory Visit: Payer: Medicare Other | Admitting: Cardiology

## 2012-04-29 VITALS — BP 142/80 | HR 83 | Ht 64.0 in | Wt 202.4 lb

## 2012-04-29 DIAGNOSIS — I259 Chronic ischemic heart disease, unspecified: Secondary | ICD-10-CM

## 2012-04-29 DIAGNOSIS — I4892 Unspecified atrial flutter: Secondary | ICD-10-CM

## 2012-04-29 DIAGNOSIS — Z7901 Long term (current) use of anticoagulants: Secondary | ICD-10-CM

## 2012-04-29 DIAGNOSIS — Z8679 Personal history of other diseases of the circulatory system: Secondary | ICD-10-CM

## 2012-04-29 DIAGNOSIS — I10 Essential (primary) hypertension: Secondary | ICD-10-CM

## 2012-04-29 MED ORDER — LISINOPRIL 40 MG PO TABS: 40.0000 mg | ORAL_TABLET | Freq: Every day | ORAL | Status: AC

## 2012-04-29 NOTE — Patient Instructions (Addendum)
Blood pressure diary x 2 weeks with readings at 8 a.m / 2:00 p.m. / 8:00 p.m.  Take Lisinopril at 10:00 p.m.   Continue taking other blood pressure medications at same time in the morning  Your physician wants you to follow up in: 6 months.  You will receive a reminder letter in the mail one-two months in advance.  If you don't receive a letter, please call our office to schedule the follow up appointment

## 2012-04-29 NOTE — Assessment & Plan Note (Signed)
Much better controlled on current combination regimen of diltiazem and metoprolol. Results of recent 48-hour Holter monitor were reviewed with the patient, and there is no current indication for permanent pacemaker. Patient is on chronic Coumadin, followed by Dr. Maryellen Pile.

## 2012-04-29 NOTE — Progress Notes (Signed)
Primary Cardiologist: Lewayne Bunting, MD   HPI: patient returns for scheduled three-month followup.  When last seen, he was referred for a 48 hour Holter monitor, per Dr. 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   This study was negative for PAF/flutter, NSVT, or significant pauses. Occasional PACs/PVCs were noted. HR range from 38 in early morning to 109 maximum, with mean of 77 bpm.  Clinically, he now reports palpitations only on rare occasion, and which are very brief in duration. His main concern is that of uncontrolled HTN. He checks his BP several times a day, with typical readings in the 160-200 systolic range. He is on multiple medications. When last seen here in clinic, Dr. Andee Lineman recommended using clonidine 0.1 mg prn for SBP greater than 180. He also noted, however, that both clonidine and labetalol had been previously discontinued, most likely due to bradycardia with HR in the 30 bpm range.  Allergies  Allergen Reactions  . Citalopram     REACTION: diarrhea  . Codeine   . Lovastatin   . Zyvox (Linezolid)   . Hydralazine     Drug induced lupus  . Norvasc (Amlodipine Besylate) Swelling  . Zaroxolyn (Metolazone) Other (See Comments)    Bad urinary incontinence    Current Outpatient Prescriptions  Medication Sig Dispense Refill  . acetaminophen (TYLENOL) 325 MG tablet Take 325 mg by mouth as needed.      . ALPRAZolam (XANAX) 1 MG tablet Take 1 mg by mouth at bedtime as needed.       Marland Kitchen aspirin EC 81 MG tablet Take 1 tablet (81 mg total) by mouth daily.      . calcium acetate (PHOSLO) 667 MG capsule Take 667 mg by mouth Three times a day.       . calcium carbonate (OS-CAL) 600 MG TABS Take 600 mg by mouth daily.      . cloNIDine (CATAPRES) 0.1 MG tablet Take one tab as needed for SBP greater than 180, no more than one tab in 24 hours.  30 tablet  3  . desipramine (NORPRAMIN) 50 MG tablet Take 100 mg by mouth at  bedtime.      Marland Kitchen diltiazem (CARDIZEM CD) 120 MG 24 hr capsule Take 120 mg by mouth daily.      . diphenhydrAMINE (BENADRYL) 25 mg capsule Take 25 mg by mouth every 6 (six) hours as needed.      . ergocalciferol (VITAMIN D2) 50000 UNITS capsule Take 50,000 Units by mouth once a week.       . folic acid (FOLVITE) 400 MCG tablet Take 400 mcg by mouth daily.      . furosemide (LASIX) 80 MG tablet Take 80 mg by mouth 2 (two) times daily.      . hydrochlorothiazide (MICROZIDE) 12.5 MG capsule Take 1 capsule (12.5 mg total) by mouth daily.      Marland Kitchen HYDROcodone-acetaminophen (VICODIN ES) 7.5-750 MG per tablet Take 1 tablet by mouth every 8 (eight) hours as needed for pain.      . hydroxychloroquine (PLAQUENIL) 200 MG tablet Take 200 mg by mouth 2 (two) times daily.        . iron polysaccharides (FERREX 150) 150 MG capsule Take 150 mg by mouth 2 (two) times daily.      . isosorbide mononitrate (IMDUR) 120 MG 24 hr tablet Take 120 mg by mouth daily.        Marland Kitchen lisinopril (PRINIVIL,ZESTRIL) 40 MG tablet Take 1 tablet (  40 mg total) by mouth daily. At 10:00 p.m.      Marland Kitchen metoprolol succinate (TOPROL-XL) 25 MG 24 hr tablet Take 1 tablet (25 mg total) by mouth daily.      . Multiple Minerals-Vitamins (CAL-MAG-ZINC-D PO) Take 1 tablet by mouth daily.      . Multiple Vitamins-Minerals (CENTRUM SILVER PO) Take 1 tablet by mouth every other day.       . niacin 500 MG tablet Take 500 mg by mouth 2 (two) times daily with a meal.      . nitroGLYCERIN (NITROSTAT) 0.4 MG SL tablet Place 0.4 mg under the tongue every 5 (five) minutes as needed.        Marland Kitchen omeprazole (PRILOSEC) 20 MG capsule Take 20 mg by mouth daily.       . polyethylene glycol (MIRALAX / GLYCOLAX) packet Take 17 g by mouth daily as needed.        . potassium chloride (KLOR-CON) 10 MEQ CR tablet Take 10 mEq by mouth daily.       . QUEtiapine (SEROQUEL) 50 MG tablet Take 50 mg by mouth at bedtime.        Marland Kitchen terazosin (HYTRIN) 5 MG capsule Take 5 mg by mouth at  bedtime.        . VOLTAREN 1 % GEL Apply 1 application topically Twice daily.      Marland Kitchen warfarin (COUMADIN) 5 MG tablet Take 1-2 tablets daily as directed by the Anticoagulation Clinic  45 tablet  3  . DISCONTD: lisinopril (PRINIVIL,ZESTRIL) 40 MG tablet Take 1 tablet (40 mg total) by mouth daily.      Marland Kitchen DISCONTD: amLODipine (NORVASC) 5 MG tablet Take 5 mg by mouth 2 (two) times daily.        Marland Kitchen DISCONTD: ferrous sulfate 325 (65 FE) MG tablet Take 325 mg by mouth 2 (two) times daily.          Past Medical History  Diagnosis Date  . Normal echocardiogram     normal left ventricular function  . Coronary artery disease     severe single vessel coronary artery disease status post drug elutingstenting of total proximal right coronary artery  august 2009  . Hypertension   . Chronic kidney disease   . Anemia   . Atrial flutter     transient  . Anxiety   . DJD (degenerative joint disease)     ambulates by wheelchair,  s/p infection of L hip with removal hardware and "spacer"placed  . Bradycardia   . Lupus     hydralazine induced lupus, resolved    Past Surgical History  Procedure Date  . Right femoral pseudoaneur     successfully trated with thrombin injection  . Replacement total knee bilateral   . Lumbar disc surgery   . Cataract surgery   . Total hip arthroplasty     left hip replacement with subsequent infection requiring removal of hardware and "spacer" placed  . Coronary angioplasty     History   Social History  . Marital Status: Single    Spouse Name: N/A    Number of Children: N/A  . Years of Education: N/A   Occupational History  . Not on file.   Social History Main Topics  . Smoking status: Never Smoker   . Smokeless tobacco: Never Used  . Alcohol Use: No  . Drug Use: No  . Sexually Active: Not on file   Other Topics Concern  . Not on file  Social History Narrative   Pt lives in [place] alone.Disabled.  Previously supervisory work.    Family  History  Problem Relation Age of Onset  . Hypertension      ROS: no nausea, vomiting; no fever, chills; no melena, hematochezia; no claudication  PHYSICAL EXAM: BP 142/80  Pulse 83  Ht 5\' 4"  (1.626 m)  Wt 202 lb 6.4 oz (91.808 kg)  BMI 34.74 kg/m2 GENERAL: 71 year old,  moderately obese, sitting upright in wheelchair; NAD  HEENT: NCAT, PERRLA, EOMI; sclera clear; no xanthelasma  NECK: palpable bilateral carotid pulses, no bruits; no JVD; no TM  LUNGS: CTA bilaterally  CARDIAC:  RRR (S1, S2); no significant murmurs; no rubs or gallops  ABDOMEN: Protuberant  EXTREMETIES: trace peripheral edema  SKIN: warm/dry; no obvious rash/lesions  MUSCULOSKELETAL: no joint deformity  NEURO: no focal deficit; NL affect    EKG: reviewed and available in Electronic Records   ASSESSMENT & PLAN:  PALPITATIONS, HX OF Much better controlled on current combination regimen of diltiazem and metoprolol. Results of recent 48-hour Holter monitor were reviewed with the patient, and there is no current indication for permanent pacemaker. Patient is on chronic Coumadin, followed by Dr. Maryellen Pile.  Essential hypertension, benign This remains to be his most difficult issue at present, requiring multiple antihypertensives. I instructed him to maintain a BP diary over the next few weeks, and to present these data at the next scheduled visit with his nephrologist. We can also review these numbers when he returns. I suggested taking several readings over the course of the day, starting with the initial a.m. reading, before taking his usual medications. I also recommended taking his lisinopril dose in the evening, so as to provide some coverage in the a.m., when his readings are the highest. Currently, he takes all of his antihypertensives in the a.m. I also strongly emphasized that he review his current antihypertensive regimen with his nephrologist, at his next OV.  Encounter for long-term (current) use of  anticoagulants Followed by his primary M.D.  CORONARY ARTERY DISEASE, S/P PTCA Quiescent on current medication regimen    [name], PAC

## 2012-04-29 NOTE — Assessment & Plan Note (Signed)
This remains to be his most difficult issue at present, requiring multiple antihypertensives. I instructed him to maintain a BP diary over the next few weeks, and to present these data at the next scheduled visit with his nephrologist. We can also review these numbers when he returns. I suggested taking several readings over the course of the day, starting with the initial a.m. reading, before taking his usual medications. I also recommended taking his lisinopril dose in the evening, so as to provide some coverage in the a.m., when his readings are the highest. Currently, he takes all of his antihypertensives in the a.m. I also strongly emphasized that he review his current antihypertensive regimen with his nephrologist, at his next OV.

## 2012-04-29 NOTE — Assessment & Plan Note (Signed)
Followed by his primary MD.   

## 2012-04-29 NOTE — Assessment & Plan Note (Signed)
Quiescent on current medication regimen. 

## 2012-05-22 ENCOUNTER — Other Ambulatory Visit: Payer: Self-pay | Admitting: Physician Assistant

## 2012-06-10 ENCOUNTER — Other Ambulatory Visit: Payer: Self-pay | Admitting: Physician Assistant

## 2012-07-25 ENCOUNTER — Other Ambulatory Visit: Payer: Self-pay | Admitting: Cardiology

## 2012-07-25 NOTE — Telephone Encounter (Signed)
Refill filled previously

## 2012-08-01 ENCOUNTER — Other Ambulatory Visit: Payer: Self-pay | Admitting: Cardiology

## 2012-08-01 MED ORDER — DILTIAZEM HCL ER COATED BEADS 120 MG PO CP24: 120.0000 mg | ORAL_CAPSULE | Freq: Every day | ORAL | Status: AC

## 2012-09-23 ENCOUNTER — Other Ambulatory Visit: Payer: Self-pay | Admitting: Cardiology

## 2013-04-24 ENCOUNTER — Ambulatory Visit: Payer: Medicare Other | Admitting: Cardiovascular Disease

## 2014-11-03 ENCOUNTER — Telehealth (HOSPITAL_COMMUNITY): Payer: Self-pay | Admitting: Specialist

## 2014-11-03 NOTE — Telephone Encounter (Signed)
Josh with [facility] called to cx Mr. [name] apptment because patient  is in the hospital

## 2014-11-04 ENCOUNTER — Ambulatory Visit (HOSPITAL_COMMUNITY): Payer: Medicare Other | Attending: Internal Medicine | Admitting: Specialist

## 2015-04-08 DEATH — deceased
